# Patient Record
Sex: Female | Born: 1950 | Race: White | Hispanic: No | Marital: Married | Smoking: Never smoker
Health system: Southern US, Community
[De-identification: ages and names within clinical notes are randomized; demographics above are authoritative.]

## PROBLEM LIST (undated history)

## (undated) DIAGNOSIS — Z460 Encounter for fitting and adjustment of spectacles and contact lenses: Secondary | ICD-10-CM

## (undated) DIAGNOSIS — E7800 Pure hypercholesterolemia, unspecified: Secondary | ICD-10-CM

## (undated) DIAGNOSIS — N6019 Diffuse cystic mastopathy of unspecified breast: Secondary | ICD-10-CM

## (undated) DIAGNOSIS — O039 Complete or unspecified spontaneous abortion without complication: Secondary | ICD-10-CM

## (undated) DIAGNOSIS — C801 Malignant (primary) neoplasm, unspecified: Secondary | ICD-10-CM

## (undated) DIAGNOSIS — K219 Gastro-esophageal reflux disease without esophagitis: Secondary | ICD-10-CM

## (undated) HISTORY — DX: Diffuse cystic mastopathy of unspecified breast: N60.19

## (undated) HISTORY — PX: STOMACH SURGERY: SHX791

## (undated) HISTORY — DX: Pure hypercholesterolemia, unspecified: E78.00

## (undated) HISTORY — PX: TUBAL LIGATION: SHX77

## (undated) HISTORY — DX: Complete or unspecified spontaneous abortion without complication: O03.9

## (undated) HISTORY — PX: DILATION AND CURETTAGE OF UTERUS: SHX78

## (undated) HISTORY — DX: Gastro-esophageal reflux disease without esophagitis: K21.9

## (undated) HISTORY — PX: BREAST CYST ASPIRATION: SHX578

## (undated) HISTORY — PX: FRACTURE SURGERY: SHX138

## (undated) HISTORY — PX: CHOLECYSTECTOMY: SHX55

## (undated) HISTORY — PX: LIVER SURGERY: SHX698

---

## 1993-08-21 HISTORY — PX: INCISION AND DRAINAGE BREAST ABSCESS: SUR672

## 2004-10-05 ENCOUNTER — Ambulatory Visit: Payer: Self-pay | Admitting: Internal Medicine

## 2005-12-06 ENCOUNTER — Ambulatory Visit: Payer: Self-pay | Admitting: Internal Medicine

## 2007-02-19 ENCOUNTER — Ambulatory Visit: Payer: Self-pay | Admitting: Internal Medicine

## 2007-10-30 ENCOUNTER — Ambulatory Visit: Payer: Self-pay

## 2008-03-09 ENCOUNTER — Ambulatory Visit: Payer: Self-pay

## 2010-03-16 ENCOUNTER — Ambulatory Visit: Payer: Self-pay

## 2010-08-09 ENCOUNTER — Ambulatory Visit: Payer: Self-pay | Admitting: Gastroenterology

## 2011-03-20 ENCOUNTER — Ambulatory Visit: Payer: Self-pay | Admitting: Internal Medicine

## 2012-07-09 ENCOUNTER — Ambulatory Visit: Payer: Self-pay | Admitting: Internal Medicine

## 2012-08-20 ENCOUNTER — Encounter: Payer: Self-pay | Admitting: Internal Medicine

## 2012-08-20 ENCOUNTER — Ambulatory Visit (INDEPENDENT_AMBULATORY_CARE_PROVIDER_SITE_OTHER): Payer: Self-pay | Admitting: Internal Medicine

## 2012-08-20 ENCOUNTER — Other Ambulatory Visit (HOSPITAL_COMMUNITY)
Admission: RE | Admit: 2012-08-20 | Discharge: 2012-08-20 | Disposition: A | Discharge status: Home / Self Care | Payer: Self-pay | Source: Ambulatory Visit | Attending: Internal Medicine | Admitting: Internal Medicine

## 2012-08-20 VITALS — BP 118/80 | HR 84 | Temp 97.9°F | Ht 66.5 in | Wt 200.0 lb

## 2012-08-20 DIAGNOSIS — K625 Hemorrhage of anus and rectum: Secondary | ICD-10-CM

## 2012-08-20 DIAGNOSIS — R5383 Other fatigue: Secondary | ICD-10-CM

## 2012-08-20 DIAGNOSIS — Z139 Encounter for screening, unspecified: Secondary | ICD-10-CM

## 2012-08-20 DIAGNOSIS — Z1151 Encounter for screening for human papillomavirus (HPV): Secondary | ICD-10-CM | POA: Insufficient documentation

## 2012-08-20 DIAGNOSIS — K219 Gastro-esophageal reflux disease without esophagitis: Secondary | ICD-10-CM | POA: Insufficient documentation

## 2012-08-20 DIAGNOSIS — Z01419 Encounter for gynecological examination (general) (routine) without abnormal findings: Secondary | ICD-10-CM | POA: Insufficient documentation

## 2012-08-20 DIAGNOSIS — R5381 Other malaise: Secondary | ICD-10-CM

## 2012-08-20 DIAGNOSIS — E7800 Pure hypercholesterolemia, unspecified: Secondary | ICD-10-CM

## 2012-08-20 LAB — CBC WITH DIFFERENTIAL/PLATELET
Basophils Absolute: 0 10*3/uL (ref 0.0–0.1)
HCT: 41.8 % (ref 36.0–46.0)
Lymphs Abs: 2.3 10*3/uL (ref 0.7–4.0)
Monocytes Relative: 10.3 % (ref 3.0–12.0)
Neutrophils Relative %: 48.6 % (ref 43.0–77.0)
Platelets: 265 10*3/uL (ref 150.0–400.0)
RDW: 12.9 % (ref 11.5–14.6)

## 2012-08-20 LAB — COMPREHENSIVE METABOLIC PANEL
ALT: 20 U/L (ref 0–35)
Alkaline Phosphatase: 52 U/L (ref 39–117)
CO2: 28 mEq/L (ref 19–32)
GFR: 101.15 mL/min (ref >60.00)
Sodium: 140 mEq/L (ref 135–145)
Total Bilirubin: 0.7 mg/dL (ref 0.3–1.2)
Total Protein: 6.9 g/dL (ref 6.0–8.3)

## 2012-08-20 LAB — LIPID PANEL
HDL: 62.6 mg/dL (ref >39.00)
VLDL: 27.4 mg/dL (ref 0.0–40.0)

## 2012-08-20 LAB — LDL CHOLESTEROL, DIRECT: Direct LDL: 171.5 mg/dL

## 2012-08-21 ENCOUNTER — Encounter: Payer: Self-pay | Admitting: Internal Medicine

## 2012-08-21 NOTE — Assessment & Plan Note (Signed)
Low cholesterol diet and exercise.  Check lipid profile.   

## 2012-08-21 NOTE — Assessment & Plan Note (Signed)
Persistent symptoms.  Take prilosec regularly.  Heme positive on exam.  Refer to GI for evaluation.  Check cbc.

## 2012-08-21 NOTE — Progress Notes (Signed)
  Subjective:    Patient ID: Dawn Swanson, female    DOB: July 11, 1951, 62 y.o.   MRN: [id number]  HPI 62 year old female with past history of GERD, fibrocystic breast disease and hypercholesterolemia who comes in today to follow up on these issues as well as for a complete physical exam.  She still has problems with increased acid reflux.  She does not take the omeprazole regularly.  Certain foods do aggravate.  No abdominal pain or cramping.  No bowel change.  No blood in her stool.  No cardiac symptoms with increased activity or exertion.  Increased stress with family situation.  Feels she is handling things relatively well.    Past Medical History  Diagnosis Date  . GERD (gastroesophageal reflux disease)   . Hypercholesterolemia   . Miscarriage   . Fibrocystic breast disease     Current Outpatient Prescriptions on File Prior to Visit  Medication Sig Dispense Refill  . omeprazole (PRILOSEC) 20 MG capsule Take 20 mg by mouth daily.      . simvastatin (ZOCOR) 10 MG tablet Take 10 mg by mouth at bedtime.        Review of Systems Patient denies any headache, lightheadedness or dizziness.  No sinus or allergy symptoms. No chest pain, tightness or palpitations.  No increased shortness of breath, cough or congestion.  No nausea or vomiting.  Acid reflux symptoms as outlined.  No abdominal pain or cramping.  No bowel change, such as diarrhea, constipation, BRBPR or melana.  No urine change.  No vaginal problems.  Feels she is handling stress relatively well.      Objective:   Physical Exam Filed Vitals:   08/20/12 1036  BP: 118/80  Pulse: 84  Temp: 97.9 F (30.52 C)   62 year old female in no acute distress.   HEENT:  Nares- clear.  Oropharynx - without lesions. NECK:  Supple.  Nontender.  No audible bruit.  HEART:  Appears to be regular. LUNGS:  No crackles or wheezing audible.  Respirations even and unlabored.  RADIAL PULSE:  Equal bilaterally.    BREASTS:  No nipple discharge or  nipple retraction present.  Could not appreciate any distinct nodules or axillary adenopathy.  ABDOMEN:  Soft, nontender.  Bowel sounds present and normal.  No audible abdominal bruit.  GU:  Normal external genitalia.  Vaginal vault without lesions.  Cervix identified.  Pap performed. Could not appreciate any adnexal masses or tenderness.   RECTAL:  Heme positive.   EXTREMITIES:  No increased edema present.  DP pulses palpable and equal bilaterally.           Assessment & Plan:  GI.  Heme positive on exam.  Check cbc.  Persistent reflux.  Take prilosec regularly.  Colonoscopy 08/09/10 with diverticulosis.  Recommended follow up colonoscopy five years.  Has had some problems with hemorrhoids.  Will treat with annusol HC suppositories.  IFOB after completion.  Refer to GI for evaluation.    CARDIOVASCULAR.  Asymptomatic.  FATIGUE.  Did report some fatigue.  Check cbc, met c and tsh.    INCREASED PSYCHOSOCIAL STRESSORS.  Feels she is handling things relatively well.  Follow.   HEALTH MAINTENANCE.  Physical today.  Pap today.  Schedule mammogram.  GI referral as outlined.  Has wanted to hold on bone density.

## 2012-08-27 ENCOUNTER — Encounter: Payer: Self-pay | Admitting: *Deleted

## 2012-09-02 ENCOUNTER — Other Ambulatory Visit (INDEPENDENT_AMBULATORY_CARE_PROVIDER_SITE_OTHER): Payer: Self-pay | Admitting: *Deleted

## 2012-09-02 ENCOUNTER — Other Ambulatory Visit: Payer: Self-pay

## 2012-09-02 DIAGNOSIS — Z139 Encounter for screening, unspecified: Secondary | ICD-10-CM

## 2012-10-05 ENCOUNTER — Other Ambulatory Visit: Payer: Self-pay

## 2012-10-18 ENCOUNTER — Encounter: Payer: Self-pay | Admitting: Internal Medicine

## 2012-10-18 ENCOUNTER — Ambulatory Visit (INDEPENDENT_AMBULATORY_CARE_PROVIDER_SITE_OTHER): Payer: Self-pay | Admitting: Internal Medicine

## 2012-10-18 VITALS — BP 120/80 | HR 102 | Temp 98.4°F | Ht 66.5 in | Wt 185.5 lb

## 2012-10-18 DIAGNOSIS — E7800 Pure hypercholesterolemia, unspecified: Secondary | ICD-10-CM

## 2012-10-18 DIAGNOSIS — K219 Gastro-esophageal reflux disease without esophagitis: Secondary | ICD-10-CM

## 2012-10-18 MED ORDER — FLUTICASONE PROPIONATE 50 MCG/ACT NA SUSP: 2.0000 | Freq: Every day | NASAL | Status: DC

## 2012-10-18 NOTE — Patient Instructions (Signed)
Saline nasal spray -  Flush nose two to three times per day.  Flonase nasal spray - two sprays each nostril one time per day - in the evening.  Mucinex DM - take one in the evening.  Robitussin DM in the evening.  I will give you an antibiotic to have if needed.

## 2012-10-20 ENCOUNTER — Encounter: Payer: Self-pay | Admitting: Internal Medicine

## 2012-10-20 NOTE — Progress Notes (Signed)
Subjective:    Patient ID: Dawn Swanson, female    DOB: 03-03-1951, 62 y.o.   MRN: [id number]  HPI 62 year old female with past history of GERD, fibrocystic breast disease and hypercholesterolemia who comes in today for a scheduled follow up.   She was having  problems with increased acid reflux. See last note for details.  Now taking omeprazole regularly.  Acid reflux improved/resolved.  No abdominal pain or cramping.  No bowel change.  No blood in her stool.  Used the suppositories and turned in her stool test.  Negative for blood.  She cancelled her GI appt and declines to reschedule.  States she does not feel she needs to go.   No cardiac symptoms with increased activity or exertion.  Increased stress with family situation.  Feels she is handling things relatively well.    Past Medical History  Diagnosis Date  . GERD (gastroesophageal reflux disease)   . Hypercholesterolemia   . Miscarriage   . Fibrocystic breast disease     Current Outpatient Prescriptions on File Prior to Visit  Medication Sig Dispense Refill  . omeprazole (PRILOSEC) 20 MG capsule Take 20 mg by mouth daily.      . simvastatin (ZOCOR) 10 MG tablet Take 10 mg by mouth at bedtime.       No current facility-administered medications on file prior to visit.    Review of Systems Patient denies any headache, lightheadedness or dizziness.  She does report that her granddaughter was sick - 2/11.  Since approximately 10/06/12, she has had increased sinus pressure.  Yellow/green mucus production.  Increased cough.  No chest congestion or sore throat.  Intermittent subjective fever.  Has taken some mucinex.  No nausea or vomiting.  Acid reflux symptoms controlled now.   No abdominal pain or cramping.  No bowel change, such as diarrhea, constipation, BRBPR or melana.  No urine change.  No vaginal problems.  Feels she is handling stress relatively well.      Objective:   Physical Exam  Filed Vitals:   10/18/12 1055  BP: 120/80   Pulse: 102  Temp: 98.4 F (47.35 C)   62 year old female in no acute distress.   HEENT:  Nares- clear except slightly erythematous turbinates.   Oropharynx - without lesions.  Minimal tenderness to palpation over the sinuses.  TMs visualized without erythema.  NECK:  Supple.  Nontender.  No audible bruit.  HEART:  Appears to be regular. LUNGS:  No crackles or wheezing audible.  Respirations even and unlabored.  RADIAL PULSE:  Equal bilaterally.   ABDOMEN:  Soft, nontender.  Bowel sounds present and normal.  No audible abdominal bruit.    EXTREMITIES:  No increased edema present.  DP pulses palpable and equal bilaterally.           Assessment & Plan:  GI.  Heme positive on exam last visit.  Also had persistent reflux.  Was instructed to take prilosec regularly.  Colonoscopy 08/09/10 with diverticulosis.  Recommended follow up colonoscopy five years.  Has had some problems with hemorrhoids.  Was treated with annusol HC suppositories.  IFOB negative.  Was referred to GI for evaluation.  She cancelled.  Acid better.  Declines referral.     CARDIOVASCULAR.  Asymptomatic.   INCREASED PSYCHOSOCIAL STRESSORS.  Feels she is handling things relatively well.  Follow.   HEALTH MAINTENANCE.  Physical last visit.  Pap negative.   She wants to get her mammo through the [facility].  Needs to schedule mammogram.  GI - as outlined.  Has wanted to hold on bone density.

## 2012-10-20 NOTE — Assessment & Plan Note (Signed)
Taking omeprazole regularly now.  No symptoms.  Follow.  Declines GI referral.

## 2012-10-20 NOTE — Assessment & Plan Note (Addendum)
Low cholesterol diet and exercise.  Has adjusted her diet and lost weight since her last visit.  Cholesterol just checked 09/25/12 revealed total cholesterol 239, triglycerides 137, HDL 63 and LDL 171.  Follow.  Declines medication.

## 2013-01-19 ENCOUNTER — Emergency Department: Payer: Self-pay | Admitting: Emergency Medicine

## 2013-01-21 ENCOUNTER — Other Ambulatory Visit: Payer: Self-pay | Admitting: Orthopedic Surgery

## 2013-01-21 ENCOUNTER — Encounter (HOSPITAL_BASED_OUTPATIENT_CLINIC_OR_DEPARTMENT_OTHER): Payer: Self-pay | Admitting: *Deleted

## 2013-01-21 NOTE — Progress Notes (Signed)
Pt came from [facility]-no heart or resp problems-uses mostly herbal or essential oils for pain-last labs 12/13-no ekg or cxr many years

## 2013-01-22 ENCOUNTER — Encounter (HOSPITAL_BASED_OUTPATIENT_CLINIC_OR_DEPARTMENT_OTHER)
Admission: RE | Disposition: A | Discharge status: Home / Self Care | Payer: Self-pay | Source: Ambulatory Visit | Attending: Orthopedic Surgery

## 2013-01-22 ENCOUNTER — Ambulatory Visit (HOSPITAL_BASED_OUTPATIENT_CLINIC_OR_DEPARTMENT_OTHER)
Admission: RE | Admit: 2013-01-22 | Discharge: 2013-01-22 | Disposition: A | Discharge status: Home / Self Care | Payer: Self-pay | Source: Ambulatory Visit | Attending: Orthopedic Surgery | Admitting: Orthopedic Surgery

## 2013-01-22 ENCOUNTER — Encounter (HOSPITAL_BASED_OUTPATIENT_CLINIC_OR_DEPARTMENT_OTHER): Payer: Self-pay | Admitting: *Deleted

## 2013-01-22 ENCOUNTER — Ambulatory Visit (HOSPITAL_BASED_OUTPATIENT_CLINIC_OR_DEPARTMENT_OTHER): Payer: Self-pay | Admitting: Certified Registered Nurse Anesthetist

## 2013-01-22 ENCOUNTER — Encounter (HOSPITAL_BASED_OUTPATIENT_CLINIC_OR_DEPARTMENT_OTHER): Payer: Self-pay | Admitting: Certified Registered Nurse Anesthetist

## 2013-01-22 DIAGNOSIS — W19XXXA Unspecified fall, initial encounter: Secondary | ICD-10-CM | POA: Insufficient documentation

## 2013-01-22 DIAGNOSIS — S52022A Displaced fracture of olecranon process without intraarticular extension of left ulna, initial encounter for closed fracture: Secondary | ICD-10-CM

## 2013-01-22 DIAGNOSIS — S52023A Displaced fracture of olecranon process without intraarticular extension of unspecified ulna, initial encounter for closed fracture: Secondary | ICD-10-CM | POA: Insufficient documentation

## 2013-01-22 HISTORY — PX: ORIF ELBOW FRACTURE: SHX5031

## 2013-01-22 HISTORY — DX: Encounter for fitting and adjustment of spectacles and contact lenses: Z46.0

## 2013-01-22 SURGERY — OPEN REDUCTION INTERNAL FIXATION (ORIF) ELBOW/OLECRANON FRACTURE
Anesthesia: Regional | Site: Elbow | Laterality: Left | Wound class: Clean

## 2013-01-22 MED ORDER — OXYCODONE-ACETAMINOPHEN 5-325 MG PO TABS: 1.0000 | ORAL_TABLET | ORAL | Status: DC | PRN

## 2013-01-22 MED ORDER — LACTATED RINGERS IV SOLN
INTRAVENOUS | Status: DC
  Administered 2013-01-22 (×2): via INTRAVENOUS

## 2013-01-22 MED ORDER — BUPIVACAINE-EPINEPHRINE PF 0.5-1:200000 % IJ SOLN
INTRAMUSCULAR | Status: DC | PRN
  Administered 2013-01-22: 30 mL

## 2013-01-22 MED ORDER — VANCOMYCIN HCL IN DEXTROSE 1-5 GM/200ML-% IV SOLN
1000.0000 mg | INTRAVENOUS | Status: AC
  Administered 2013-01-22: 1000 mg via INTRAVENOUS

## 2013-01-22 MED ORDER — DEXAMETHASONE SODIUM PHOSPHATE 10 MG/ML IJ SOLN
INTRAMUSCULAR | Status: DC | PRN
  Administered 2013-01-22: 10 mg via INTRAVENOUS

## 2013-01-22 MED ORDER — MIDAZOLAM HCL 5 MG/5ML IJ SOLN
INTRAMUSCULAR | Status: DC | PRN
  Administered 2013-01-22: 1 mg via INTRAVENOUS

## 2013-01-22 MED ORDER — FENTANYL CITRATE 0.05 MG/ML IJ SOLN
50.0000 ug | INTRAMUSCULAR | Status: DC | PRN
  Administered 2013-01-22: 50 ug via INTRAVENOUS

## 2013-01-22 MED ORDER — ONDANSETRON HCL 4 MG/2ML IJ SOLN
INTRAMUSCULAR | Status: DC | PRN
  Administered 2013-01-22: 4 mg via INTRAVENOUS

## 2013-01-22 MED ORDER — PROPOFOL 10 MG/ML IV BOLUS
INTRAVENOUS | Status: DC | PRN
  Administered 2013-01-22: 200 mg via INTRAVENOUS

## 2013-01-22 MED ORDER — BUPIVACAINE HCL (PF) 0.5 % IJ SOLN
INTRAMUSCULAR | Status: DC | PRN
  Administered 2013-01-22: 10 mL

## 2013-01-22 MED ORDER — MIDAZOLAM HCL 2 MG/2ML IJ SOLN
1.0000 mg | INTRAMUSCULAR | Status: DC | PRN
  Administered 2013-01-22: 1 mg via INTRAVENOUS

## 2013-01-22 MED ORDER — OXYCODONE HCL 5 MG PO TABS: 5.0000 mg | ORAL_TABLET | Freq: Once | ORAL | Status: DC | PRN

## 2013-01-22 MED ORDER — HYDROMORPHONE HCL PF 1 MG/ML IJ SOLN: 0.2500 mg | INTRAMUSCULAR | Status: DC | PRN

## 2013-01-22 MED ORDER — OXYCODONE HCL 5 MG/5ML PO SOLN: 5.0000 mg | Freq: Once | ORAL | Status: DC | PRN

## 2013-01-22 MED ORDER — CHLORHEXIDINE GLUCONATE 4 % EX LIQD: 60.0000 mL | Freq: Once | CUTANEOUS | Status: DC

## 2013-01-22 MED ORDER — LIDOCAINE HCL (CARDIAC) 20 MG/ML IV SOLN
INTRAVENOUS | Status: DC | PRN
  Administered 2013-01-22: 30 mg via INTRAVENOUS

## 2013-01-22 MED ORDER — 0.9 % SODIUM CHLORIDE (POUR BTL) OPTIME
TOPICAL | Status: DC | PRN
  Administered 2013-01-22: 1000 mL

## 2013-01-22 SURGICAL SUPPLY — 86 items
APL SKNCLS STERI-STRIP NONHPOA (GAUZE/BANDAGES/DRESSINGS)
BAG DECANTER FOR FLEXI CONT (MISCELLANEOUS) IMPLANT
BANDAGE ELASTIC 3 VELCRO ST LF (GAUZE/BANDAGES/DRESSINGS) ×2 IMPLANT
BANDAGE ELASTIC 4 VELCRO ST LF (GAUZE/BANDAGES/DRESSINGS) ×4 IMPLANT
BANDAGE GAUZE ELAST BULKY 4 IN (GAUZE/BANDAGES/DRESSINGS) ×2 IMPLANT
BENZOIN TINCTURE PRP APPL 2/3 (GAUZE/BANDAGES/DRESSINGS) IMPLANT
BIT DRILL 2 FAST STEP (BIT) ×2 IMPLANT
BIT DRILL 2.5X2.75 QC CALB (BIT) ×2 IMPLANT
BLADE MINI RND TIP GREEN BEAV (BLADE) IMPLANT
BLADE SURG 15 STRL LF DISP TIS (BLADE) ×1 IMPLANT
BLADE SURG 15 STRL SS (BLADE) ×2
BNDG CMPR 9X4 STRL LF SNTH (GAUZE/BANDAGES/DRESSINGS) ×1
BNDG CMPR MD 5X2 ELC HKLP STRL (GAUZE/BANDAGES/DRESSINGS)
BNDG ELASTIC 2 VLCR STRL LF (GAUZE/BANDAGES/DRESSINGS) IMPLANT
BNDG ESMARK 4X9 LF (GAUZE/BANDAGES/DRESSINGS) ×2 IMPLANT
CANISTER SUCTION 1200CC (MISCELLANEOUS) ×2 IMPLANT
CLOTH BEACON ORANGE TIMEOUT ST (SAFETY) ×2 IMPLANT
CORDS BIPOLAR (ELECTRODE) ×2 IMPLANT
COVER TABLE BACK 60X90 (DRAPES) ×2 IMPLANT
CUFF TOURNIQUET SINGLE 18IN (TOURNIQUET CUFF) ×2 IMPLANT
DECANTER SPIKE VIAL GLASS SM (MISCELLANEOUS) IMPLANT
DRAPE EXTREMITY T 121X128X90 (DRAPE) ×2 IMPLANT
DRAPE OEC MINIVIEW 54X84 (DRAPES) ×2 IMPLANT
DRAPE SURG 17X23 STRL (DRAPES) ×2 IMPLANT
DURAPREP 26ML APPLICATOR (WOUND CARE) ×2 IMPLANT
ELECT REM PT RETURN 9FT ADLT (ELECTROSURGICAL)
ELECTRODE REM PT RTRN 9FT ADLT (ELECTROSURGICAL) IMPLANT
GAUZE SPONGE 4X4 16PLY XRAY LF (GAUZE/BANDAGES/DRESSINGS) IMPLANT
GAUZE XEROFORM 1X8 LF (GAUZE/BANDAGES/DRESSINGS) IMPLANT
GLOVE BIO SURGEON STRL SZ 6.5 (GLOVE) ×2 IMPLANT
GLOVE BIO SURGEON STRL SZ8 (GLOVE) ×2 IMPLANT
GLOVE BIOGEL PI IND STRL 7.0 (GLOVE) ×1 IMPLANT
GLOVE BIOGEL PI IND STRL 8.5 (GLOVE) ×1 IMPLANT
GLOVE BIOGEL PI INDICATOR 7.0 (GLOVE) ×1
GLOVE BIOGEL PI INDICATOR 8.5 (GLOVE) ×1
GLOVE EXAM NITRILE LRG STRL (GLOVE) ×2 IMPLANT
GLOVE SURG ORTHO 8.0 STRL STRW (GLOVE) ×2 IMPLANT
GOWN PREVENTION PLUS XLARGE (GOWN DISPOSABLE) ×2 IMPLANT
GOWN PREVENTION PLUS XXLARGE (GOWN DISPOSABLE) ×2 IMPLANT
GOWN STRL REIN 2XL XLG LVL4 (GOWN DISPOSABLE) ×2 IMPLANT
LOOP VESSEL MAXI BLUE (MISCELLANEOUS) IMPLANT
NEEDLE HYPO 25X1 1.5 SAFETY (NEEDLE) IMPLANT
NS IRRIG 1000ML POUR BTL (IV SOLUTION) ×2 IMPLANT
PACK BASIN DAY SURGERY FS (CUSTOM PROCEDURE TRAY) ×2 IMPLANT
PAD CAST 3X4 CTTN HI CHSV (CAST SUPPLIES) ×1 IMPLANT
PAD CAST 4YDX4 CTTN HI CHSV (CAST SUPPLIES) ×2 IMPLANT
PADDING CAST ABS 4INX4YD NS (CAST SUPPLIES) ×1
PADDING CAST ABS COTTON 4X4 ST (CAST SUPPLIES) ×1 IMPLANT
PADDING CAST COTTON 3X4 STRL (CAST SUPPLIES) ×2
PADDING CAST COTTON 4X4 STRL (CAST SUPPLIES) ×4
PADDING UNDERCAST 2  STERILE (CAST SUPPLIES) IMPLANT
PENCIL BUTTON HOLSTER BLD 10FT (ELECTRODE) IMPLANT
PLATE OLECRANON LRG (Plate) ×2 IMPLANT
SCREW CORT T15 24X3.5XST LCK (Screw) ×1 IMPLANT
SCREW CORTICAL 3.5X24MM (Screw) ×2 IMPLANT
SCREW CORTICAL LOW PROF 3.5X20 (Screw) ×2 IMPLANT
SCREW LOCK CORT STAR 3.5X36 (Screw) ×2 IMPLANT
SCREW LOW PROFILE 18MMX3.5MM (Screw) ×2 IMPLANT
SCREW LOW PROFILE 22MMX3.5MM (Screw) ×4 IMPLANT
SCREW NON LOCKING LP 3.5 16MM (Screw) ×6 IMPLANT
SCREW PEG 2.5X16 NONLOCK (Screw) ×2 IMPLANT
SCREW PEG 2.5X22 NONLOCK (Screw) ×2 IMPLANT
SHEET MEDIUM DRAPE 40X70 STRL (DRAPES) ×4 IMPLANT
SLING ARM FOAM STRAP XLG (SOFTGOODS) ×2 IMPLANT
SPLINT FAST PLASTER 5X30 (CAST SUPPLIES) ×10
SPLINT PLASTER CAST FAST 5X30 (CAST SUPPLIES) ×10 IMPLANT
SPLINT PLASTER CAST XFAST 4X15 (CAST SUPPLIES) IMPLANT
SPLINT PLASTER XTRA FAST SET 4 (CAST SUPPLIES)
SPONGE GAUZE 4X4 12PLY (GAUZE/BANDAGES/DRESSINGS) ×2 IMPLANT
STOCKINETTE 4X48 STRL (DRAPES) ×2 IMPLANT
STRIP CLOSURE SKIN 1/2X4 (GAUZE/BANDAGES/DRESSINGS) IMPLANT
SUCTION FRAZIER TIP 10 FR DISP (SUCTIONS) ×2 IMPLANT
SUT ETHILON 4 0 PS 2 18 (SUTURE) IMPLANT
SUT MERSILENE 4 0 P 3 (SUTURE) IMPLANT
SUT PROLENE 3 0 PS 2 (SUTURE) IMPLANT
SUT SILK 2 0 FS (SUTURE) IMPLANT
SUT VIC AB 2-0 SH 27 (SUTURE) ×4
SUT VIC AB 2-0 SH 27XBRD (SUTURE) ×2 IMPLANT
SUT VIC AB 3-0 FS2 27 (SUTURE) ×2 IMPLANT
SUT VICRYL RAPIDE 4/0 PS 2 (SUTURE) ×6 IMPLANT
SYR BULB 3OZ (MISCELLANEOUS) ×2 IMPLANT
SYRINGE 10CC LL (SYRINGE) IMPLANT
TOWEL OR 17X24 6PK STRL BLUE (TOWEL DISPOSABLE) ×4 IMPLANT
TUBE CONNECTING 20X1/4 (TUBING) ×2 IMPLANT
UNDERPAD 30X30 INCONTINENT (UNDERPADS AND DIAPERS) ×2 IMPLANT
WASHER 3.5MM (Orthopedic Implant) ×4 IMPLANT

## 2013-01-22 NOTE — H&P (Signed)
Dawn Swanson is an 62 y.o. female.   Chief Complaint: left elbow pain HPI: as above s/p fall onto left elbow with displaced olecranon fracture  Past Medical History  Diagnosis Date  . GERD (gastroesophageal reflux disease)   . Hypercholesterolemia   . Miscarriage   . Fibrocystic breast disease   . Contact lens/glasses fitting     wears contacts or glasses    Past Surgical History  Procedure Laterality Date  . Dilation and curettage of uterus    . Incision and drainage breast abscess  1995  . Tubal ligation    . Breast cyst aspiration      Family History  Problem Relation Age of Onset  . Stomach cancer Father   . Kidney failure Mother   . Hodgkin's lymphoma Sister     died age 41  . Glaucoma Brother   . Breast cancer Neg Hx   . Colon cancer Neg Hx    Social History:  reports that she has never smoked. She has never used smokeless tobacco. She reports that she does not drink alcohol or use illicit drugs.  Allergies:  Allergies  Allergen Reactions  . Penicillins Swelling    Medications Prior to Admission  Medication Sig Dispense Refill  . omeprazole (PRILOSEC) 20 MG capsule Take 20 mg by mouth daily.      Marland Kitchen oxyCODONE-acetaminophen (PERCOCET) 10-325 MG per tablet Take 1 tablet by mouth every 4 (four) hours as needed for pain.      . fluticasone (FLONASE) 50 MCG/ACT nasal spray Place 2 sprays into the nose daily.  16 g  1    No results found for this or any previous visit (from the past 48 hour(s)). No results found.  Review of Systems  All other systems reviewed and are negative.    Blood pressure 124/52, pulse 64, temperature 97.8 F (36.6 C), temperature source Oral, resp. rate 16, height 5\' 7"  (1.702 m), weight 85.276 kg (188 lb), last menstrual period 08/21/1999, SpO2 100.00%. Physical Exam  Constitutional: She is oriented to person, place, and time. She appears well-developed and well-nourished.  HENT:  Head: Normocephalic and atraumatic.   Cardiovascular: Normal rate.   Respiratory: Effort normal.  Musculoskeletal:       Left elbow: She exhibits swelling and deformity. Tenderness found. Olecranon process tenderness noted.  Neurological: She is alert and oriented to person, place, and time.  Skin: Skin is warm.  Psychiatric: She has a normal mood and affect. Her behavior is normal. Judgment and thought content normal.     Assessment/Plan As above  Plan ORIF  Syon Tews A 01/22/2013, 12:02 PM

## 2013-01-22 NOTE — Op Note (Signed)
See note [id number]

## 2013-01-22 NOTE — Anesthesia Preprocedure Evaluation (Signed)
Anesthesia Evaluation  Patient identified by MRN, date of birth, ID band Patient awake    Reviewed: Allergy & Precautions, H&P , NPO status , Patient's Chart, lab work & pertinent test results  Airway Mallampati: II TM Distance: >3 FB Neck ROM: Full    Dental no notable dental hx. (+) Chipped and Dental Advisory Given   Pulmonary neg pulmonary ROS,  breath sounds clear to auscultation  Pulmonary exam normal       Cardiovascular negative cardio ROS  Rhythm:Regular Rate:Normal     Neuro/Psych negative neurological ROS  negative psych ROS   GI/Hepatic Neg liver ROS, GERD-  Medicated and Controlled,  Endo/Other  negative endocrine ROS  Renal/GU negative Renal ROS  negative genitourinary   Musculoskeletal   Abdominal   Peds  Hematology negative hematology ROS (+)   Anesthesia Other Findings   Reproductive/Obstetrics negative OB ROS                           Anesthesia Physical Anesthesia Plan  ASA: II  Anesthesia Plan: General and Regional   Post-op Pain Management:    Induction: Intravenous  Airway Management Planned: LMA  Additional Equipment:   Intra-op Plan:   Post-operative Plan: Extubation in OR  Informed Consent: I have reviewed the patients History and Physical, chart, labs and discussed the procedure including the risks, benefits and alternatives for the proposed anesthesia with the patient or authorized representative who has indicated his/her understanding and acceptance.   Dental advisory given  Plan Discussed with: CRNA  Anesthesia Plan Comments:         Anesthesia Quick Evaluation

## 2013-01-22 NOTE — Anesthesia Procedure Notes (Signed)
Anesthesia Regional Block:  Supraclavicular block  Pre-Anesthetic Checklist: ,, timeout performed, Correct Patient, Correct Site, Correct Laterality, Correct Procedure, Correct Position, site marked, Risks and benefits discussed, pre-op evaluation, post-op pain management  Laterality: Left  Prep: Maximum Sterile Barrier Precautions used and chloraprep       Needles:  Injection technique: Single-shot  Needle Type: Echogenic Stimulator Needle     Needle Length: 5cm 5 cm Needle Gauge: 22 and 22 G    Additional Needles:  Procedures: ultrasound guided (picture in chart) Supraclavicular block Narrative:  Start time: 01/22/2013 11:01 AM End time: 01/22/2013 11:21 AM Injection made incrementally with aspirations every 5 mL. Anesthesiologist: Nyana Haren,MD  Additional Notes: 2% Lidocaine skin wheel. Intercostobrachial block with 5cc of 0.5% Bupivicaine plain.  Supraclavicular block

## 2013-01-22 NOTE — Transfer of Care (Signed)
Immediate Anesthesia Transfer of Care Note  Patient: Dawn Swanson  Procedure(s) Performed: Procedure(s): OPEN REDUCTION INTERNAL FIXATION (ORIF) ELBOW/OLECRANON FRACTURE (Left)  Patient Location: PACU  Anesthesia Type:GA combined with regional for post-op pain  Level of Consciousness: awake and patient cooperative  Airway & Oxygen Therapy: Patient Spontanous Breathing and Patient connected to face mask oxygen  Post-op Assessment: Report given to PACU RN and Post -op Vital signs reviewed and stable  Post vital signs: Reviewed and stable  Complications: No apparent anesthesia complications

## 2013-01-22 NOTE — Progress Notes (Signed)
Assisted Dr. Fitzgerald with left, ultrasound guided, supraclavicular block. Side rails up, monitors on throughout procedure. See vital signs in flow sheet. Tolerated Procedure well. 

## 2013-01-22 NOTE — Anesthesia Postprocedure Evaluation (Signed)
  Anesthesia Post-op Note  Patient: Dawn Swanson  Procedure(s) Performed: Procedure(s): OPEN REDUCTION INTERNAL FIXATION (ORIF) ELBOW/OLECRANON FRACTURE (Left)  Patient Location: PACU  Anesthesia Type:GA combined with regional for post-op pain  Level of Consciousness: awake, alert  and oriented  Airway and Oxygen Therapy: Patient Spontanous Breathing  Post-op Pain: none  Post-op Assessment: Post-op Vital signs reviewed, Patient's Cardiovascular Status Stable, Respiratory Function Stable, Patent Airway and No signs of Nausea or vomiting  Post-op Vital Signs: Reviewed and stable  Complications: No apparent anesthesia complications

## 2013-01-23 ENCOUNTER — Encounter (HOSPITAL_BASED_OUTPATIENT_CLINIC_OR_DEPARTMENT_OTHER): Payer: Self-pay | Admitting: Orthopedic Surgery

## 2013-01-23 LAB — POCT HEMOGLOBIN-HEMACUE: Hemoglobin: 14.7 g/dL (ref 12.0–15.0)

## 2013-01-23 NOTE — Op Note (Signed)
NAMEGAYE, SCORZA NO.:  [id number]  MEDICAL RECORD NO.:  [id number]  LOCATION:                                 FACILITY:  PHYSICIAN:  Artist Pais. Wilbur Oakland, M.D.DATE OF BIRTH:  [birth year]  DATE OF PROCEDURE:  01/22/2013 DATE OF DISCHARGE:  01/22/2013                              OPERATIVE REPORT   PREOPERATIVE DIAGNOSIS:  Displaced olecranon fracture, left side.  POSTOPERATIVE DIAGNOSIS:  Displaced olecranon fracture, left side.  PROCEDURE:  Open reduction and internal fixation, left olecranon fracture.  SURGEON:  Artist Pais. Mina Marble, MD  ASSISTANT:  Cindee Salt, MD  ANESTHESIA:  General.  TOURNIQUET TIME:  An hour and 3 minutes.  No complication.  No drains.  DESCRIPTION OF PROCEDURE:  The patient was taken to the operating suite. After the induction of adequate general anesthesia, left upper extremity was prepped and draped in usual sterile fashion.  The patient also had a preoperative supraclavicular block with good analgesia obtained.  When she was prepped and draped, an Esmarch was used to exsanguinate the limb.  Tourniquet was inflated to 250 mmHg.  At this point in time, a standard posterior approach to the olecranon was undertaken with the arm adducted across the chest and placed in a well-padded Mayo stand.  Skin was incised along the tip of the olecranon proximally and distally for 6- 8 cm in the direction.  Flaps were raised.  Dissection was carried down to fracture site, which easily identified.  The proximal fragment was subperiosteally dissected as well as the distal fragment.  Visualization revealed a large butterfly fragment and comminution distal to the joint. Fracture fragments were debrided of clot.  Reduction was performed with longitudinal traction and then extension of the arm using a reduction clamp.  Once this was done, 2 interfragmentary screws of 2.5 mm were placed across the butterfly fragment and next a Alps elbow  medium-sized plate was placed under direct and fluoroscopic guidance across the fracture site proximally and distally with 3 screws proximal and multiple screws distal.  Intraoperative fluoroscopy revealed adequate reduction in AP, lateral, and oblique view.  The wound was then thoroughly irrigated and closed in layers of 2-0 undyed Vicryl and 4-0 Vicryl Rapide on the skin. Xeroform, 4x4s, fluffs, and a posterior elbow splint was applied.  The patient tolerated the procedure well and went to recovery room in stable fashion.     Artist Pais Mina Marble, M.D.     MAW/MEDQ  D:  01/22/2013  T:  01/23/2013  Job:  [id number]

## 2013-02-18 ENCOUNTER — Ambulatory Visit: Payer: Self-pay | Admitting: Internal Medicine

## 2013-02-25 ENCOUNTER — Encounter: Payer: Self-pay | Admitting: Internal Medicine

## 2013-02-28 ENCOUNTER — Encounter: Payer: Self-pay | Admitting: *Deleted

## 2013-03-13 ENCOUNTER — Encounter: Payer: Self-pay | Admitting: Internal Medicine

## 2013-04-17 ENCOUNTER — Ambulatory Visit: Payer: Self-pay | Admitting: Internal Medicine

## 2013-06-26 ENCOUNTER — Other Ambulatory Visit: Payer: Self-pay

## 2013-08-25 ENCOUNTER — Encounter: Payer: Self-pay | Admitting: Internal Medicine

## 2013-08-25 ENCOUNTER — Ambulatory Visit (INDEPENDENT_AMBULATORY_CARE_PROVIDER_SITE_OTHER): Payer: Self-pay | Admitting: Internal Medicine

## 2013-08-25 ENCOUNTER — Other Ambulatory Visit (HOSPITAL_COMMUNITY)
Admission: RE | Admit: 2013-08-25 | Discharge: 2013-08-25 | Disposition: A | Discharge status: Home / Self Care | Payer: Self-pay | Source: Ambulatory Visit | Attending: Internal Medicine | Admitting: Internal Medicine

## 2013-08-25 VITALS — BP 110/80 | HR 83 | Temp 98.1°F | Ht 66.0 in | Wt 187.0 lb

## 2013-08-25 DIAGNOSIS — R5383 Other fatigue: Secondary | ICD-10-CM

## 2013-08-25 DIAGNOSIS — Z124 Encounter for screening for malignant neoplasm of cervix: Secondary | ICD-10-CM

## 2013-08-25 DIAGNOSIS — K219 Gastro-esophageal reflux disease without esophagitis: Secondary | ICD-10-CM

## 2013-08-25 DIAGNOSIS — S42309D Unspecified fracture of shaft of humerus, unspecified arm, subsequent encounter for fracture with routine healing: Secondary | ICD-10-CM

## 2013-08-25 DIAGNOSIS — E7800 Pure hypercholesterolemia, unspecified: Secondary | ICD-10-CM

## 2013-08-25 DIAGNOSIS — Z01419 Encounter for gynecological examination (general) (routine) without abnormal findings: Secondary | ICD-10-CM | POA: Insufficient documentation

## 2013-08-25 DIAGNOSIS — R112 Nausea with vomiting, unspecified: Secondary | ICD-10-CM

## 2013-08-25 DIAGNOSIS — Z1211 Encounter for screening for malignant neoplasm of colon: Secondary | ICD-10-CM

## 2013-08-25 DIAGNOSIS — R5381 Other malaise: Secondary | ICD-10-CM

## 2013-08-25 DIAGNOSIS — Z1151 Encounter for screening for human papillomavirus (HPV): Secondary | ICD-10-CM | POA: Insufficient documentation

## 2013-08-25 DIAGNOSIS — S42402D Unspecified fracture of lower end of left humerus, subsequent encounter for fracture with routine healing: Secondary | ICD-10-CM

## 2013-08-25 NOTE — Progress Notes (Signed)
Pre-visit discussion using our clinic review tool. No additional management support is needed unless otherwise documented below in the visit note.  

## 2013-08-26 ENCOUNTER — Encounter: Payer: Self-pay | Admitting: Internal Medicine

## 2013-08-26 DIAGNOSIS — S42402A Unspecified fracture of lower end of left humerus, initial encounter for closed fracture: Secondary | ICD-10-CM | POA: Insufficient documentation

## 2013-08-26 DIAGNOSIS — R112 Nausea with vomiting, unspecified: Secondary | ICD-10-CM | POA: Insufficient documentation

## 2013-08-26 NOTE — Assessment & Plan Note (Signed)
Low cholesterol diet and exercise.  Follow.  Declines medication.   

## 2013-08-26 NOTE — Assessment & Plan Note (Signed)
Off omeprazole.  Have discussed at length wit her regarding the need to take a PPI regularly to control symptoms.  She feels her symptoms have improved with decreasing sugar intake and with mineral oils.   Follow.  Declines GI referral.

## 2013-08-26 NOTE — Assessment & Plan Note (Signed)
S/p injury and left arm/elbow fracture.  S/p surgery.  Doing well.

## 2013-08-26 NOTE — Progress Notes (Signed)
Subjective:    Patient ID: Dawn Swanson, female    DOB: 21-Oct-1950, 63 y.o.   MRN: [id number]  HPI 63 year old female with past history of GERD, fibrocystic breast disease and hypercholesterolemia who comes in today to follow up on these issues as well as for a complete physical exam.  She tripped over a rug and fell and fractured her arm/elbow (01/19/13).  Is s/p surgery.  With residual fourth and fifth finger tingling.  Good rom.  Doing well.  She was having  problems with increased acid reflux. See last note for details.  Not taking omeprazole.  Has started taking essential oils.  States the acid is under reasonable control.  Still some break through.  Has intermittent nausea.  Has improved since she cut back on sugar.  No abdominal pain or cramping.  No bowel change.  No blood in her stool.  She cancelled her GI appt and declines to reschedule.  States she does not feel she needs to go.   No cardiac symptoms with increased activity or exertion.  Increased stress with family situation.  Feels she is handling things relatively well.     Past Medical History  Diagnosis Date  . GERD (gastroesophageal reflux disease)   . Hypercholesterolemia   . Miscarriage   . Fibrocystic breast disease   . Contact lens/glasses fitting     wears contacts or glasses    Review of Systems Patient denies any headache, lightheadedness or dizziness.  No increased sinus pressure or congestion. No chest pain or tightness.  No sob.   No nausea or vomiting.  Acid reflux as outlined.  Some nausea.   No abdominal pain or cramping.  No bowel change, such as diarrhea, constipation, BRBPR or melana.  No urine change.  No vaginal problems.  Feels she is handling stress relatively well.  S/p left arm/elbow fracture.  S/p surgery and doing well.  Follow.       Objective:   Physical Exam  Filed Vitals:   08/25/13 1335  BP: 110/80  Pulse: 83  Temp: 98.1 F (84.54 C)   63 year old female in no acute distress.   HEENT:   Nares- clear.  Oropharynx - without lesions. NECK:  Supple.  Nontender.  No audible bruit.  HEART:  Appears to be regular. LUNGS:  No crackles or wheezing audible.  Respirations even and unlabored.  RADIAL PULSE:  Equal bilaterally.    BREASTS:  No nipple discharge or nipple retraction present.  Could not appreciate any distinct nodules or axillary adenopathy.  ABDOMEN:  Soft, nontender.  Bowel sounds present and normal.  No audible abdominal bruit.  GU:  Normal external genitalia.  Vaginal vault without lesions.  Cervix identified.  Pap performed. Could not appreciate any adnexal masses or tenderness.   RECTAL:  Heme negative.   EXTREMITIES:  No increased edema present.  DP pulses palpable and equal bilaterally.          Assessment & Plan:  GI.  Has persistent reflux.  Was instructed to take prilosec regularly.  She has stopped.  She is taking essential oils.  Feels this is helping.  Some intermittent nausea and vomiting.  Reports better since decreasing sugar in her diet.  disucssed further w/up.  She declines.  Will monitor for other triggers.  Colonoscopy 08/09/10 with diverticulosis.  Recommended follow up colonoscopy five years..  Was referred to GI for evaluation.  She cancelled.   Declines referral back.     CARDIOVASCULAR.  Asymptomatic.   INCREASED PSYCHOSOCIAL STRESSORS.  Feels she is handling things relatively well.  Follow.   HEALTH MAINTENANCE.  Physical today.  Pelvic/pap today.   GI - as outlined.  Has wanted to hold on bone density.  Mammogram 02/18/13 - [facility].

## 2013-08-26 NOTE — Assessment & Plan Note (Signed)
Declines further w/up.  Check metabolic panel and liver panel.  Follow.

## 2013-08-27 ENCOUNTER — Encounter: Payer: Self-pay | Admitting: Internal Medicine

## 2013-10-01 ENCOUNTER — Other Ambulatory Visit (INDEPENDENT_AMBULATORY_CARE_PROVIDER_SITE_OTHER): Payer: Medicare HMO

## 2013-10-01 DIAGNOSIS — E7800 Pure hypercholesterolemia, unspecified: Secondary | ICD-10-CM

## 2013-10-01 DIAGNOSIS — R112 Nausea with vomiting, unspecified: Secondary | ICD-10-CM

## 2013-10-01 DIAGNOSIS — R5381 Other malaise: Secondary | ICD-10-CM

## 2013-10-01 DIAGNOSIS — R5383 Other fatigue: Secondary | ICD-10-CM

## 2013-10-01 LAB — HEPATIC FUNCTION PANEL
ALBUMIN: 3.3 g/dL — AB (ref 3.5–5.2)
ALT: 16 U/L (ref 0–35)
AST: 17 U/L (ref 0–37)
Alkaline Phosphatase: 48 U/L (ref 39–117)
Bilirubin, Direct: 0 mg/dL (ref 0.0–0.3)
TOTAL PROTEIN: 5.9 g/dL — AB (ref 6.0–8.3)
Total Bilirubin: 0.6 mg/dL (ref 0.3–1.2)

## 2013-10-01 LAB — LIPID PANEL
CHOLESTEROL: 243 mg/dL — AB (ref 0–200)
HDL: 58.7 mg/dL (ref >39.00)
Total CHOL/HDL Ratio: 4
Triglycerides: 193 mg/dL — ABNORMAL HIGH (ref 0.0–149.0)
VLDL: 38.6 mg/dL (ref 0.0–40.0)

## 2013-10-01 LAB — BASIC METABOLIC PANEL
BUN: 13 mg/dL (ref 6–23)
CALCIUM: 9.5 mg/dL (ref 8.4–10.5)
CHLORIDE: 105 meq/L (ref 96–112)
CO2: 27 meq/L (ref 19–32)
Creatinine, Ser: 0.6 mg/dL (ref 0.4–1.2)
GFR: 98.98 mL/min (ref >60.00)
GLUCOSE: 77 mg/dL (ref 70–99)
POTASSIUM: 4.6 meq/L (ref 3.5–5.1)
SODIUM: 140 meq/L (ref 135–145)

## 2013-10-01 LAB — CBC WITH DIFFERENTIAL/PLATELET
BASOS ABS: 0 10*3/uL (ref 0.0–0.1)
Basophils Relative: 0.6 % (ref 0.0–3.0)
EOS ABS: 0.2 10*3/uL (ref 0.0–0.7)
Eosinophils Relative: 4 % (ref 0.0–5.0)
HEMATOCRIT: 42.9 % (ref 36.0–46.0)
HEMOGLOBIN: 14.1 g/dL (ref 12.0–15.0)
LYMPHS ABS: 1.9 10*3/uL (ref 0.7–4.0)
Lymphocytes Relative: 36.5 % (ref 12.0–46.0)
MCHC: 32.7 g/dL (ref 30.0–36.0)
MCV: 89.6 fl (ref 78.0–100.0)
MONO ABS: 0.6 10*3/uL (ref 0.1–1.0)
Monocytes Relative: 11.1 % (ref 3.0–12.0)
NEUTROS ABS: 2.5 10*3/uL (ref 1.4–7.7)
Neutrophils Relative %: 47.8 % (ref 43.0–77.0)
Platelets: 292 10*3/uL (ref 150.0–400.0)
RBC: 4.79 Mil/uL (ref 3.87–5.11)
RDW: 13.6 % (ref 11.5–14.6)
WBC: 5.3 10*3/uL (ref 4.5–10.5)

## 2013-10-01 LAB — LDL CHOLESTEROL, DIRECT: Direct LDL: 164.6 mg/dL

## 2013-10-01 LAB — TSH: TSH: 4.38 u[IU]/mL (ref 0.35–5.50)

## 2013-10-07 ENCOUNTER — Encounter: Payer: Self-pay | Admitting: Internal Medicine

## 2013-11-13 ENCOUNTER — Other Ambulatory Visit (INDEPENDENT_AMBULATORY_CARE_PROVIDER_SITE_OTHER): Payer: Medicare HMO

## 2013-11-13 DIAGNOSIS — Z1211 Encounter for screening for malignant neoplasm of colon: Secondary | ICD-10-CM

## 2013-11-13 LAB — FECAL OCCULT BLOOD, IMMUNOCHEMICAL: Fecal Occult Bld: NEGATIVE

## 2013-11-14 ENCOUNTER — Encounter: Payer: Self-pay | Admitting: Internal Medicine

## 2013-11-27 ENCOUNTER — Ambulatory Visit: Payer: Self-pay | Admitting: Internal Medicine

## 2014-04-20 ENCOUNTER — Encounter: Payer: Self-pay | Admitting: Internal Medicine

## 2014-04-20 ENCOUNTER — Ambulatory Visit (INDEPENDENT_AMBULATORY_CARE_PROVIDER_SITE_OTHER): Payer: Medicare HMO | Admitting: Internal Medicine

## 2014-04-20 VITALS — BP 130/80 | HR 73 | Temp 98.4°F | Ht 66.0 in | Wt 173.2 lb

## 2014-04-20 DIAGNOSIS — R112 Nausea with vomiting, unspecified: Secondary | ICD-10-CM

## 2014-04-20 DIAGNOSIS — N814 Uterovaginal prolapse, unspecified: Secondary | ICD-10-CM

## 2014-04-20 DIAGNOSIS — R1084 Generalized abdominal pain: Secondary | ICD-10-CM

## 2014-04-20 DIAGNOSIS — K219 Gastro-esophageal reflux disease without esophagitis: Secondary | ICD-10-CM

## 2014-04-20 DIAGNOSIS — R634 Abnormal weight loss: Secondary | ICD-10-CM

## 2014-04-20 DIAGNOSIS — E7800 Pure hypercholesterolemia, unspecified: Secondary | ICD-10-CM

## 2014-04-20 NOTE — Progress Notes (Signed)
Pre visit review using our clinic review tool, if applicable. No additional management support is needed unless otherwise documented below in the visit note. 

## 2014-04-21 LAB — LIPID PANEL
CHOLESTEROL: 243 mg/dL — AB (ref 0–200)
HDL: 58.8 mg/dL (ref >39.00)
LDL CALC: 159 mg/dL — AB (ref 0–99)
NonHDL: 184.2
Total CHOL/HDL Ratio: 4
Triglycerides: 128 mg/dL (ref 0.0–149.0)
VLDL: 25.6 mg/dL (ref 0.0–40.0)

## 2014-04-21 LAB — CBC WITH DIFFERENTIAL/PLATELET
Basophils Absolute: 0 10*3/uL (ref 0.0–0.1)
Basophils Relative: 0.6 % (ref 0.0–3.0)
EOS ABS: 0.1 10*3/uL (ref 0.0–0.7)
EOS PCT: 2 % (ref 0.0–5.0)
HEMATOCRIT: 42.5 % (ref 36.0–46.0)
Hemoglobin: 14.4 g/dL (ref 12.0–15.0)
LYMPHS ABS: 2.4 10*3/uL (ref 0.7–4.0)
Lymphocytes Relative: 37.1 % (ref 12.0–46.0)
MCHC: 34 g/dL (ref 30.0–36.0)
MCV: 90.6 fl (ref 78.0–100.0)
MONO ABS: 0.5 10*3/uL (ref 0.1–1.0)
Monocytes Relative: 7.7 % (ref 3.0–12.0)
Neutro Abs: 3.4 10*3/uL (ref 1.4–7.7)
Neutrophils Relative %: 52.6 % (ref 43.0–77.0)
PLATELETS: 276 10*3/uL (ref 150.0–400.0)
RBC: 4.69 Mil/uL (ref 3.87–5.11)
RDW: 13 % (ref 11.5–15.5)
WBC: 6.5 10*3/uL (ref 4.0–10.5)

## 2014-04-21 LAB — HEPATIC FUNCTION PANEL
ALT: 16 U/L (ref 0–35)
AST: 18 U/L (ref 0–37)
Albumin: 3.3 g/dL — ABNORMAL LOW (ref 3.5–5.2)
Alkaline Phosphatase: 41 U/L (ref 39–117)
BILIRUBIN TOTAL: 0.7 mg/dL (ref 0.2–1.2)
Bilirubin, Direct: 0.1 mg/dL (ref 0.0–0.3)
Total Protein: 5.8 g/dL — ABNORMAL LOW (ref 6.0–8.3)

## 2014-04-21 LAB — BASIC METABOLIC PANEL
BUN: 9 mg/dL (ref 6–23)
CHLORIDE: 104 meq/L (ref 96–112)
CO2: 29 mEq/L (ref 19–32)
Calcium: 9.8 mg/dL (ref 8.4–10.5)
Creatinine, Ser: 0.6 mg/dL (ref 0.4–1.2)
GFR: 110.7 mL/min (ref >60.00)
Glucose, Bld: 84 mg/dL (ref 70–99)
POTASSIUM: 4.2 meq/L (ref 3.5–5.1)
Sodium: 139 mEq/L (ref 135–145)

## 2014-04-21 LAB — LIPASE: Lipase: 38 U/L (ref 11.0–59.0)

## 2014-04-21 LAB — TSH: TSH: 2.9 u[IU]/mL (ref 0.35–4.50)

## 2014-04-21 LAB — AMYLASE: Amylase: 47 U/L (ref 27–131)

## 2014-04-22 ENCOUNTER — Encounter: Payer: Self-pay | Admitting: Internal Medicine

## 2014-04-22 DIAGNOSIS — R109 Unspecified abdominal pain: Secondary | ICD-10-CM | POA: Insufficient documentation

## 2014-04-22 NOTE — Assessment & Plan Note (Signed)
Persistent, worsening abdominal discomfort, nausea, vomiting, decreased appetite and weight loss.  Will check cbc, met b, liver panel, amylase and lipase.  Check abdominal and pelvic ultrasound.  Wanted to start PPI.  She declined.  Did agree to start zantac 122m bid.  Discussed GI referral.  She cancelled her previous appt.  Wants to start with above testing first and then if persistent problems, will agree to referral.

## 2014-04-22 NOTE — Progress Notes (Signed)
Subjective:    Patient ID: Dawn Swanson, female    DOB: 1951-06-27, 63 y.o.   MRN: [id number]  Abdominal Pain  63 year old female with past history of GERD, fibrocystic breast disease and hypercholesterolemia who comes in today as a work in with concerns regarding some persistent intermittent nausea, abdominal discomfort, weight loss and intermittent emesis.  She has had intermittent problems with her stomach for a while now.  Appears to have worsened recently.  Over the last three weeks, she has noticed some persistent intermittent nausea.  Has been using peppermint oil with noted improvement.  She also has intermittent episodes of emesis.  States she will wake up with the nausea.  also has increased abdominal discomfort.  Diffuse.  No bowel change.  Some problems with what she describes as hemorrhoids.  Will flare intermittently.  Intermittent blood streaks on the tissue.  No other bleeding.  Some acid reflux at times.  Decreased appetite.  Not eating well.  Has lost weight.  States she drinks chocolate milk in the am.  This helps some with the nausea.  States if she leans over (sometimes), may aggravate the nausea.  Stomach feels hard after she eats.   No cardiac symptoms with increased activity or exertion.  She is also concerned regarding "uterine prolapse".  States is getting worse.  Wants referral.      Past Medical History  Diagnosis Date  . GERD (gastroesophageal reflux disease)   . Hypercholesterolemia   . Miscarriage   . Fibrocystic breast disease   . Contact lens/glasses fitting     wears contacts or glasses    Review of Systems  Gastrointestinal: Positive for abdominal pain.  Patient denies any headache, lightheadedness or dizziness.  No increased sinus pressure or congestion.  No chest pain or tightness.  No sob.   Nausea and vomiting as outlined.   Acid reflux as outlined.  Some nausea.   Increased abdominal discomfort as outlined.  Decreased appetite.  Weight loss.  No bowel  change, such as diarrhea, constipation, or melana. Some streaks of blood on the tissues intermittently.   No urine change.  Uterine prolapse as outlined.        Objective:   Physical Exam  Filed Vitals:   04/20/14 1529  BP: 130/80  Pulse: 73  Temp: 98.4 F (36.9 C)   Blood pressure recheck:  36/78  63 year old female in no acute distress.   HEENT:  Nares- clear.  Oropharynx - without lesions. NECK:  Supple.  Nontender.  No audible bruit.  HEART:  Appears to be regular. LUNGS:  No crackles or wheezing audible.  Respirations even and unlabored.  RADIAL PULSE:  Equal bilaterally.     ABDOMEN:  Soft.  Minimal tenderness to palpation.  No rebound or guarding.   Bowel sounds present and normal.  No audible abdominal bruit.    EXTREMITIES:  No increased edema present.  DP pulses palpable and equal bilaterally.          Assessment & Plan:  GI.  Has persistent reflux.  W Colonoscopy 08/09/10 with diverticulosis.  Recommended follow up colonoscopy five years..  Was referred to GI for evaluation.  She previously cancelled.     CARDIOVASCULAR.  Asymptomatic.   INCREASED PSYCHOSOCIAL STRESSORS.  Feels she is handling things relatively well.  Follow.   HEALTH MAINTENANCE.  Physical 08/25/13.  PAP 08/25/13 - negative with negative HPV.   GI - as outlined.  Has wanted to hold on bone density.  Mammogram 02/18/13 - Birads II.  Needs f/u mammogram scheduled.    I spent 25 minutes with the patient and more than 50% of the time was spent in consultation regarding the above.

## 2014-04-22 NOTE — Assessment & Plan Note (Signed)
Low cholesterol diet and exercise.  Follow.  Declines medication.

## 2014-04-22 NOTE — Assessment & Plan Note (Signed)
Off omeprazole.  Have discussed at length wit her regarding the need to take a PPI regularly to control symptoms.  She declines restarting a PPI.  Did agree to zantac.  Zantac as directed.  Discussed GI referral.  She wants to hold at this time.  W/up as outlined above.

## 2014-04-22 NOTE — Assessment & Plan Note (Signed)
Nausea, vomiting, decreased appetite and weight loss as outlined.  W/up as outlined above.  Avoid foods that aggravate.  Further w/up pending results of abvoe.

## 2014-04-22 NOTE — Telephone Encounter (Signed)
Labs mailed to patient.

## 2014-04-28 ENCOUNTER — Ambulatory Visit: Payer: Self-pay | Admitting: Internal Medicine

## 2014-05-13 ENCOUNTER — Ambulatory Visit (INDEPENDENT_AMBULATORY_CARE_PROVIDER_SITE_OTHER): Payer: Commercial Managed Care - HMO | Admitting: Internal Medicine

## 2014-05-13 ENCOUNTER — Encounter: Payer: Self-pay | Admitting: Internal Medicine

## 2014-05-13 VITALS — BP 120/80 | HR 86 | Temp 98.5°F | Ht 66.0 in | Wt 171.2 lb

## 2014-05-13 DIAGNOSIS — K219 Gastro-esophageal reflux disease without esophagitis: Secondary | ICD-10-CM

## 2014-05-13 DIAGNOSIS — R1084 Generalized abdominal pain: Secondary | ICD-10-CM

## 2014-05-13 DIAGNOSIS — R160 Hepatomegaly, not elsewhere classified: Secondary | ICD-10-CM

## 2014-05-13 DIAGNOSIS — K769 Liver disease, unspecified: Secondary | ICD-10-CM

## 2014-05-13 NOTE — Progress Notes (Signed)
Pre visit review using our clinic review tool, if applicable. No additional management support is needed unless otherwise documented below in the visit note. 

## 2014-05-14 ENCOUNTER — Encounter: Payer: Self-pay | Admitting: Internal Medicine

## 2014-05-14 NOTE — Assessment & Plan Note (Signed)
Persistent abdominal discomfort, nausea and vomiting and weight loss.  Ultrasound with liver mass.  Will check MRI liver.  Further w/up pending results.  Hold on medication changes at this time.

## 2014-05-14 NOTE — Progress Notes (Signed)
Subjective:    Patient ID: Dawn Swanson, female    DOB: [birth year], 63 y.o.   MRN: [id number]  Abdominal Pain  63 year old female with past history of GERD, fibrocystic breast disease and hypercholesterolemia who comes in today for a scheduled follow up.  Was just recently seen as a work in with concerns regarding some persistent intermittent nausea, abdominal discomfort, weight loss and intermittent emesis.  She has had intermittent problems with her stomach for a while now.  Appears to have worsened recently.  She has noticed some persistent intermittent nausea.  Has been using peppermint oil with noted improvement.  She also has intermittent episodes of emesis.  Has only had one episode since her last visit here.   No significant bowel change.   Decreased appetite.  Not eating well.  Has lost weight.  Lose a couple of more pounds since her last visit.  States she drinks chocolate milk in the am.  See last note for more specific details.  This helps some with the nausea.   No cardiac symptoms with increased activity or exertion.  Breathing stable.     Past Medical History  Diagnosis Date  . GERD (gastroesophageal reflux disease)   . Hypercholesterolemia   . Miscarriage   . Fibrocystic breast disease   . Contact lens/glasses fitting     wears contacts or glasses    Current Outpatient Prescriptions on File Prior to Visit  Medication Sig Dispense Refill  . Multiple Vitamin (MULTIVITAMIN) tablet Take 1 tablet by mouth 2 (two) times daily.       No current facility-administered medications on file prior to visit.    Review of Systems  Gastrointestinal: Positive for abdominal pain.  Patient denies any headache, lightheadedness or dizziness.  No increased sinus pressure or congestion.  No chest pain or tightness.  No sob.   Nausea and vomiting as outlined.   No significant acid reflux reported today.  Did report zantac made her feel worse.   Some nausea.   Increased abdominal discomfort as  outlined.  Decreased appetite.  Weight loss.  No bowel change, such as diarrhea, constipation, or melana.   No urine change.   Had CT abdomen and pelvis that revealed mass left lobe liver.  MRI liver recommended.  We discussed this today.       Objective:   Physical Exam  Filed Vitals:   05/13/14 1236  BP: 120/80  Pulse: 86  Temp: 98.5 F (61.49 C)   63 year old female in no acute distress.   HEENT:  Oropharynx - without lesions.  Moist mucus membranes.  NECK:  Supple.  Nontender.   HEART:  Appears to be regular. LUNGS:  No crackles or wheezing audible.  Respirations even and unlabored.  RADIAL PULSE:  Equal bilaterally.     ABDOMEN:  Soft.  Minimal tenderness to palpation.  No rebound or guarding.   Bowel sounds present and normal.  No audible abdominal bruit.    EXTREMITIES:  No increased edema present.  DP pulses palpable and equal bilaterally.          Assessment & Plan:  GI.  Has persistent reflux.  W Colonoscopy 08/09/10 with diverticulosis.  Recommended follow up colonoscopy five years..  Was referred to GI for evaluation.  She previously cancelled.   Now with abnormal CT.  Will pursue MRI liver.    CARDIOVASCULAR.  Asymptomatic.   INCREASED PSYCHOSOCIAL STRESSORS.  Feels she is handling things relatively well.  Follow.  HEALTH MAINTENANCE.  Physical 08/25/13.  PAP 08/25/13 - negative with negative HPV.   GI - as outlined.  Has wanted to hold on bone density.  Mammogram 02/18/13 - Birads II.  Needs f/u mammogram scheduled.

## 2014-05-14 NOTE — Assessment & Plan Note (Signed)
Off omeprazole.  Have discussed at length wit her regarding the need to take a PPI regularly to control symptoms.  She declined restarting a PPI.  Did agree to zantac last visit.  Reports zantac makes her feel worse.  Have discussed GI referral.  She had wanted to hold on referral.  Check MRI liver as outlined.

## 2014-05-28 ENCOUNTER — Telehealth: Payer: Self-pay | Admitting: *Deleted

## 2014-05-28 NOTE — Telephone Encounter (Signed)
Pt called states she can feel a lump that is sore to touch just below her breast bone.  She further states she was evaluated while in [place] by an RN and was told it is possibly a hernia.  Pt also states she is unable to lay on her side as she becomes nauseated.  Pt is requesting to be scheduled with GI as previously discussed.  Please advise

## 2014-05-28 NOTE — Telephone Encounter (Signed)
I do not mind the referral to GI, but per records she is scheduled to have MRI liver tomorrow.  I would like to see what this shows and then refer as appropriate.

## 2014-05-28 NOTE — Telephone Encounter (Signed)
Left details on voicemail per [facility]

## 2014-05-29 ENCOUNTER — Ambulatory Visit: Payer: Self-pay | Admitting: Internal Medicine

## 2014-06-01 ENCOUNTER — Encounter: Payer: Self-pay | Admitting: Internal Medicine

## 2014-06-01 DIAGNOSIS — R110 Nausea: Secondary | ICD-10-CM

## 2014-06-01 DIAGNOSIS — R634 Abnormal weight loss: Secondary | ICD-10-CM

## 2014-06-01 DIAGNOSIS — K219 Gastro-esophageal reflux disease without esophagitis: Secondary | ICD-10-CM

## 2014-06-01 DIAGNOSIS — R160 Hepatomegaly, not elsewhere classified: Secondary | ICD-10-CM

## 2014-06-01 NOTE — Telephone Encounter (Signed)
Pt notified of MRI results and need for GI referral.  Order placed for GI referral.

## 2014-06-01 NOTE — Addendum Note (Signed)
Addended by: Alisa Graff on: 06/01/2014 05:11 PM   Modules accepted: Orders

## 2014-06-05 ENCOUNTER — Encounter: Payer: Self-pay | Admitting: Internal Medicine

## 2014-06-08 ENCOUNTER — Telehealth: Payer: Self-pay | Admitting: Internal Medicine

## 2014-06-08 NOTE — Telephone Encounter (Signed)
See below

## 2014-06-08 NOTE — Telephone Encounter (Signed)
The patient was diagnosed with a liver mass at [facility] ER over the weekend . She stated that it is a chance of it rupturing . She will speak with Brent Bulla first. She may call back to speak with Dr. Nicki Reaper.

## 2014-06-08 NOTE — Telephone Encounter (Signed)
Called pt.  She was admitted (observation) at [facility].  Discharge home today with plans for them to f/u with her this week regarding her liver mass (hemangioma).  States the MDs at [facility] are meeting Wednesday to discuss her case.

## 2014-06-10 ENCOUNTER — Encounter: Payer: Self-pay | Admitting: Internal Medicine

## 2014-06-10 ENCOUNTER — Telehealth: Payer: Self-pay | Admitting: Internal Medicine

## 2014-06-10 DIAGNOSIS — N83202 Unspecified ovarian cyst, left side: Secondary | ICD-10-CM | POA: Insufficient documentation

## 2014-06-10 NOTE — Telephone Encounter (Signed)
Opened in error

## 2014-06-17 ENCOUNTER — Encounter: Payer: Self-pay | Admitting: Internal Medicine

## 2014-07-22 ENCOUNTER — Other Ambulatory Visit: Payer: Self-pay | Admitting: Internal Medicine

## 2014-07-27 ENCOUNTER — Telehealth: Payer: Self-pay | Admitting: Internal Medicine

## 2014-07-27 NOTE — Telephone Encounter (Signed)
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 The patient is wanting a second opinion. In order to get that second opinion she will need a referral from her primary . She is wanting a referral to Dr. Alcide Clever in [place] , [place] phone # [phone].

## 2014-07-28 NOTE — Telephone Encounter (Signed)
I do not mind making the referral, but I need to know what type of doctor - Dr Alcide Clever (she requested).  Is he oncology or gastroenterology?  Just let me know and will place the order.

## 2014-07-28 NOTE — Telephone Encounter (Signed)
I spoke with pt, she is unsure what type of MD Dr Alcide Clever is, she says she believes they are a [facility].  I called the office, the recording says [facility].

## 2014-07-28 NOTE — Telephone Encounter (Signed)
Phone message copied and information given to [place].  Will let me know about referral.

## 2015-01-25 ENCOUNTER — Encounter
Admission: RE | Admit: 2015-01-25 | Discharge: 2015-01-25 | Disposition: A | Discharge status: Home / Self Care | Payer: Commercial Managed Care - HMO | Source: Ambulatory Visit | Attending: Obstetrics & Gynecology | Admitting: Obstetrics & Gynecology

## 2015-01-25 DIAGNOSIS — Z01810 Encounter for preprocedural cardiovascular examination: Secondary | ICD-10-CM | POA: Insufficient documentation

## 2015-01-25 DIAGNOSIS — N813 Complete uterovaginal prolapse: Secondary | ICD-10-CM | POA: Insufficient documentation

## 2015-01-25 DIAGNOSIS — Z01812 Encounter for preprocedural laboratory examination: Secondary | ICD-10-CM | POA: Insufficient documentation

## 2015-01-25 DIAGNOSIS — N8111 Cystocele, midline: Secondary | ICD-10-CM | POA: Diagnosis not present

## 2015-01-25 HISTORY — DX: Malignant (primary) neoplasm, unspecified: C80.1

## 2015-01-25 LAB — BASIC METABOLIC PANEL
ANION GAP: 7 (ref 5–15)
BUN: 21 mg/dL — ABNORMAL HIGH (ref 6–20)
CO2: 28 mmol/L (ref 22–32)
Calcium: 9.6 mg/dL (ref 8.9–10.3)
Chloride: 102 mmol/L (ref 101–111)
Creatinine, Ser: 0.58 mg/dL (ref 0.44–1.00)
Glucose, Bld: 77 mg/dL (ref 65–99)
POTASSIUM: 3.9 mmol/L (ref 3.5–5.1)
Sodium: 137 mmol/L (ref 135–145)

## 2015-01-25 LAB — CBC
HCT: 39.4 % (ref 35.0–47.0)
Hemoglobin: 13.2 g/dL (ref 12.0–16.0)
MCH: 31.6 pg (ref 26.0–34.0)
MCHC: 33.4 g/dL (ref 32.0–36.0)
MCV: 94.8 fL (ref 80.0–100.0)
Platelets: 195 10*3/uL (ref 150–440)
RBC: 4.16 MIL/uL (ref 3.80–5.20)
RDW: 13.6 % (ref 11.5–14.5)
WBC: 4.8 10*3/uL (ref 3.6–11.0)

## 2015-01-25 LAB — APTT: aPTT: 28 seconds (ref 24–36)

## 2015-01-25 LAB — TYPE AND SCREEN
ABO/RH(D): AB POS
Antibody Screen: NEGATIVE

## 2015-01-25 LAB — PROTIME-INR
INR: 1.01
Prothrombin Time: 13.5 seconds (ref 11.4–15.0)

## 2015-01-25 NOTE — Patient Instructions (Addendum)
  Your procedure is scheduled on: Tuesday 02/09/15 Report to Day Surgery. Medical mall Entrance To find out your arrival time please call [phone] between 1PM - 3PM on Monday 02/08/15.  Remember: Instructions that are not followed completely may result in serious medical risk, up to and including death, or upon the discretion of your surgeon and anesthesiologist your surgery may need to be rescheduled.    __x__ 1. Do not eat food or drink liquids after midnight. No gum chewing or hard candies.     __x__ 2. No Alcohol for 24 hours before or after surgery.   ____ 3. Bring all medications with you on the day of surgery if instructed.    __x__ 4. Notify your doctor if there is any change in your medical condition     (cold, fever, infections).     Do not wear jewelry, make-up, hairpins, clips or nail polish.  Do not wear lotions, powders, or perfumes.  Do not shave 48 hours prior to surgery. Men may shave face and neck.  Do not bring valuables to the hospital.    [facility] is not responsible for any belongings or valuables.               Contacts, dentures or bridgework may not be worn into surgery.  Leave your suitcase in the car. After surgery it may be brought to your room.  For patients admitted to the hospital, discharge time is determined by your                treatment team.   Patients discharged the day of surgery will not be allowed to drive home.   Please read over the following fact sheets that you were given:   Surgical Site Infection Prevention   ____ Take these medicines the morning of surgery with A SIP OF WATER:    1.   2.   3.   4.  5.  6.  ____ Fleet Enema (as directed)   __x_ Use CHG Soap as directed S[employer]  ____ Use inhalers on the day of surgery  ____ Stop metformin 2 days prior to surgery    ____ Take 1/2 of usual insulin dose the night before surgery and none on the morning of surgery.   ____ Stop Coumadin/Plavix/aspirin on   ____ Stop  Anti-inflammatories on    ____ Stop supplements until after surgery.    ____ Bring C-Pap to the hospital.

## 2015-02-06 DIAGNOSIS — N819 Female genital prolapse, unspecified: Secondary | ICD-10-CM | POA: Diagnosis present

## 2015-02-06 DIAGNOSIS — N8111 Cystocele, midline: Secondary | ICD-10-CM | POA: Diagnosis present

## 2015-02-09 ENCOUNTER — Ambulatory Visit: Payer: Commercial Managed Care - HMO | Admitting: Anesthesiology

## 2015-02-09 ENCOUNTER — Encounter: Payer: Self-pay | Admitting: *Deleted

## 2015-02-09 ENCOUNTER — Encounter
Admission: RE | Disposition: A | Discharge status: Home / Self Care | Payer: Self-pay | Source: Ambulatory Visit | Attending: Obstetrics & Gynecology

## 2015-02-09 ENCOUNTER — Observation Stay
Admission: RE | Admit: 2015-02-09 | Discharge: 2015-02-10 | Disposition: A | Discharge status: Home / Self Care | Payer: Commercial Managed Care - HMO | Source: Ambulatory Visit | Attending: Obstetrics & Gynecology | Admitting: Obstetrics & Gynecology

## 2015-02-09 DIAGNOSIS — C169 Malignant neoplasm of stomach, unspecified: Secondary | ICD-10-CM | POA: Insufficient documentation

## 2015-02-09 DIAGNOSIS — Z803 Family history of malignant neoplasm of breast: Secondary | ICD-10-CM | POA: Insufficient documentation

## 2015-02-09 DIAGNOSIS — Z800 Family history of malignant neoplasm of digestive organs: Secondary | ICD-10-CM | POA: Diagnosis not present

## 2015-02-09 DIAGNOSIS — R338 Other retention of urine: Secondary | ICD-10-CM | POA: Diagnosis not present

## 2015-02-09 DIAGNOSIS — Z87898 Personal history of other specified conditions: Secondary | ICD-10-CM

## 2015-02-09 DIAGNOSIS — N8189 Other female genital prolapse: Secondary | ICD-10-CM | POA: Diagnosis not present

## 2015-02-09 DIAGNOSIS — N819 Female genital prolapse, unspecified: Secondary | ICD-10-CM | POA: Diagnosis present

## 2015-02-09 DIAGNOSIS — Z880 Allergy status to penicillin: Secondary | ICD-10-CM | POA: Insufficient documentation

## 2015-02-09 DIAGNOSIS — Z9049 Acquired absence of other specified parts of digestive tract: Secondary | ICD-10-CM | POA: Diagnosis not present

## 2015-02-09 DIAGNOSIS — Z79899 Other long term (current) drug therapy: Secondary | ICD-10-CM | POA: Diagnosis not present

## 2015-02-09 DIAGNOSIS — Z807 Family history of other malignant neoplasms of lymphoid, hematopoietic and related tissues: Secondary | ICD-10-CM | POA: Insufficient documentation

## 2015-02-09 DIAGNOSIS — Z841 Family history of disorders of kidney and ureter: Secondary | ICD-10-CM | POA: Diagnosis not present

## 2015-02-09 DIAGNOSIS — N8111 Cystocele, midline: Secondary | ICD-10-CM | POA: Diagnosis present

## 2015-02-09 DIAGNOSIS — C787 Secondary malignant neoplasm of liver and intrahepatic bile duct: Secondary | ICD-10-CM | POA: Insufficient documentation

## 2015-02-09 DIAGNOSIS — Z90710 Acquired absence of both cervix and uterus: Secondary | ICD-10-CM | POA: Diagnosis present

## 2015-02-09 HISTORY — PX: SALPINGOOPHORECTOMY: SHX82

## 2015-02-09 HISTORY — PX: VAGINAL HYSTERECTOMY: SHX2639

## 2015-02-09 HISTORY — PX: CYSTOCELE REPAIR: SHX163

## 2015-02-09 LAB — TYPE AND SCREEN
ABO/RH(D): AB POS
Antibody Screen: NEGATIVE

## 2015-02-09 SURGERY — HYSTERECTOMY, VAGINAL
Anesthesia: General | Wound class: Clean Contaminated

## 2015-02-09 MED ORDER — LACTATED RINGERS IV SOLN
INTRAVENOUS | Status: DC
  Administered 2015-02-09 (×2): via INTRAVENOUS

## 2015-02-09 MED ORDER — CLINDAMYCIN PHOSPHATE 900 MG/50ML IV SOLN
INTRAVENOUS | Status: AC
  Administered 2015-02-09: 900 mg via INTRAVENOUS
  Filled 2015-02-09: qty 50

## 2015-02-09 MED ORDER — LIDOCAINE-EPINEPHRINE (PF) 1 %-1:200000 IJ SOLN
INTRAMUSCULAR | Status: AC
  Filled 2015-02-09: qty 60

## 2015-02-09 MED ORDER — CLINDAMYCIN PHOSPHATE 900 MG/50ML IV SOLN
900.0000 mg | Freq: Once | INTRAVENOUS | Status: AC
  Administered 2015-02-09: 900 mg via INTRAVENOUS

## 2015-02-09 MED ORDER — GLYCOPYRROLATE 0.2 MG/ML IJ SOLN
INTRAMUSCULAR | Status: DC | PRN
  Administered 2015-02-09: 0.6 mg via INTRAVENOUS
  Administered 2015-02-09: 0.2 mg via INTRAVENOUS

## 2015-02-09 MED ORDER — OXYCODONE-ACETAMINOPHEN 5-325 MG PO TABS: 1.0000 | ORAL_TABLET | ORAL | Status: DC | PRN

## 2015-02-09 MED ORDER — ACETAMINOPHEN 325 MG PO TABS: 650.0000 mg | ORAL_TABLET | ORAL | Status: DC | PRN

## 2015-02-09 MED ORDER — FAMOTIDINE 20 MG PO TABS
ORAL_TABLET | ORAL | Status: AC
  Administered 2015-02-09: 20 mg via ORAL
  Filled 2015-02-09: qty 1

## 2015-02-09 MED ORDER — DOCUSATE SODIUM 100 MG PO CAPS
100.0000 mg | ORAL_CAPSULE | Freq: Two times a day (BID) | ORAL | Status: DC
  Administered 2015-02-09: 100 mg via ORAL
  Filled 2015-02-09: qty 1

## 2015-02-09 MED ORDER — SULFANILAMIDE 15 % VA CREA
TOPICAL_CREAM | VAGINAL | Status: AC
  Filled 2015-02-09: qty 120

## 2015-02-09 MED ORDER — ONDANSETRON HCL 4 MG/2ML IJ SOLN
INTRAMUSCULAR | Status: DC | PRN
  Administered 2015-02-09: 4 mg via INTRAVENOUS

## 2015-02-09 MED ORDER — LIDOCAINE-EPINEPHRINE 1 %-1:100000 IJ SOLN
INTRAMUSCULAR | Status: AC
  Filled 2015-02-09: qty 2

## 2015-02-09 MED ORDER — FAMOTIDINE 20 MG PO TABS
20.0000 mg | ORAL_TABLET | Freq: Once | ORAL | Status: AC
  Administered 2015-02-09: 20 mg via ORAL

## 2015-02-09 MED ORDER — KETOROLAC TROMETHAMINE 30 MG/ML IJ SOLN: 30.0000 mg | Freq: Four times a day (QID) | INTRAMUSCULAR | Status: DC

## 2015-02-09 MED ORDER — PROPOFOL 10 MG/ML IV BOLUS
INTRAVENOUS | Status: DC | PRN
  Administered 2015-02-09: 120 mg via INTRAVENOUS

## 2015-02-09 MED ORDER — MORPHINE SULFATE 2 MG/ML IJ SOLN: 1.0000 mg | INTRAMUSCULAR | Status: DC | PRN

## 2015-02-09 MED ORDER — NEOSTIGMINE METHYLSULFATE 10 MG/10ML IV SOLN
INTRAVENOUS | Status: DC | PRN
  Administered 2015-02-09: 3 mg via INTRAVENOUS

## 2015-02-09 MED ORDER — LIDOCAINE-EPINEPHRINE 1 %-1:100000 IJ SOLN
INTRAMUSCULAR | Status: DC | PRN
  Administered 2015-02-09 (×2): 5 mL

## 2015-02-09 MED ORDER — SIMETHICONE 80 MG PO CHEW: 80.0000 mg | CHEWABLE_TABLET | Freq: Four times a day (QID) | ORAL | Status: DC | PRN

## 2015-02-09 MED ORDER — ONDANSETRON HCL 4 MG/2ML IJ SOLN: 4.0000 mg | Freq: Once | INTRAMUSCULAR | Status: DC | PRN

## 2015-02-09 MED ORDER — PHENYLEPHRINE HCL 10 MG/ML IJ SOLN
INTRAMUSCULAR | Status: DC | PRN
  Administered 2015-02-09 (×2): 100 ug via INTRAVENOUS
  Administered 2015-02-09: 200 ug via INTRAVENOUS

## 2015-02-09 MED ORDER — LACTATED RINGERS IV SOLN
INTRAVENOUS | Status: DC
Start: 2015-02-09 — End: 2015-02-10
  Administered 2015-02-09 – 2015-02-10 (×2): via INTRAVENOUS

## 2015-02-09 MED ORDER — ONDANSETRON HCL 4 MG PO TABS: 4.0000 mg | ORAL_TABLET | Freq: Four times a day (QID) | ORAL | Status: DC | PRN

## 2015-02-09 MED ORDER — LIDOCAINE HCL (PF) 1 % IJ SOLN
INTRAMUSCULAR | Status: AC
  Filled 2015-02-09: qty 30

## 2015-02-09 MED ORDER — SULFANILAMIDE 15 % VA CREA
TOPICAL_CREAM | VAGINAL | Status: DC | PRN
  Administered 2015-02-09: 1 via VAGINAL

## 2015-02-09 MED ORDER — ROCURONIUM BROMIDE 100 MG/10ML IV SOLN
INTRAVENOUS | Status: DC | PRN
  Administered 2015-02-09: 40 mg via INTRAVENOUS
  Administered 2015-02-09: 10 mg via INTRAVENOUS

## 2015-02-09 MED ORDER — FENTANYL CITRATE (PF) 100 MCG/2ML IJ SOLN
INTRAMUSCULAR | Status: AC
  Administered 2015-02-09: 25 ug via INTRAVENOUS
  Filled 2015-02-09: qty 2

## 2015-02-09 MED ORDER — MIDAZOLAM HCL 2 MG/2ML IJ SOLN
INTRAMUSCULAR | Status: DC | PRN
  Administered 2015-02-09: 2 mg via INTRAVENOUS

## 2015-02-09 MED ORDER — KETOROLAC TROMETHAMINE 30 MG/ML IJ SOLN
15.0000 mg | Freq: Four times a day (QID) | INTRAMUSCULAR | Status: DC
  Administered 2015-02-10: 15 mg via INTRAVENOUS
  Filled 2015-02-09: qty 1

## 2015-02-09 MED ORDER — KETOROLAC TROMETHAMINE 60 MG/2ML IM SOLN
INTRAMUSCULAR | Status: AC
  Administered 2015-02-09: 30 mg
  Filled 2015-02-09: qty 2

## 2015-02-09 MED ORDER — KETOROLAC TROMETHAMINE 30 MG/ML IJ SOLN
30.0000 mg | Freq: Four times a day (QID) | INTRAMUSCULAR | Status: DC
  Administered 2015-02-09: 30 mg via INTRAVENOUS
  Filled 2015-02-09: qty 1

## 2015-02-09 MED ORDER — ONDANSETRON HCL 4 MG/2ML IJ SOLN: 4.0000 mg | Freq: Four times a day (QID) | INTRAMUSCULAR | Status: DC | PRN

## 2015-02-09 MED ORDER — LIDOCAINE HCL (PF) 1 % IJ SOLN
INTRAMUSCULAR | Status: AC
Start: 2015-02-09 — End: 2015-02-09
  Filled 2015-02-09: qty 60

## 2015-02-09 MED ORDER — SENNOSIDES-DOCUSATE SODIUM 8.6-50 MG PO TABS: 1.0000 | ORAL_TABLET | Freq: Every evening | ORAL | Status: DC | PRN

## 2015-02-09 MED ORDER — FENTANYL CITRATE (PF) 100 MCG/2ML IJ SOLN
25.0000 ug | INTRAMUSCULAR | Status: DC | PRN
  Administered 2015-02-09 (×4): 25 ug via INTRAVENOUS

## 2015-02-09 MED ORDER — LIDOCAINE HCL (PF) 1 % IJ SOLN
INTRAMUSCULAR | Status: AC
  Filled 2015-02-09: qty 2

## 2015-02-09 MED ORDER — FENTANYL CITRATE (PF) 100 MCG/2ML IJ SOLN
INTRAMUSCULAR | Status: DC | PRN
  Administered 2015-02-09: 50 ug via INTRAVENOUS
  Administered 2015-02-09: 100 ug via INTRAVENOUS
  Administered 2015-02-09: 50 ug via INTRAVENOUS

## 2015-02-09 MED ORDER — LIDOCAINE HCL (CARDIAC) 20 MG/ML IV SOLN
INTRAVENOUS | Status: DC | PRN
  Administered 2015-02-09: 80 mg via INTRAVENOUS

## 2015-02-09 SURGICAL SUPPLY — 46 items
BAG URO DRAIN 2000ML W/SPOUT (MISCELLANEOUS) ×4 IMPLANT
BLADE SURG 15 STRL LF DISP TIS (BLADE) ×2 IMPLANT
BLADE SURG 15 STRL SS (BLADE) ×2
BLADE SURG SZ10 CARB STEEL (BLADE) ×4 IMPLANT
CANISTER SUCT 1200ML W/VALVE (MISCELLANEOUS) ×4 IMPLANT
CATH FOLEY 2WAY  5CC 16FR (CATHETERS) ×2
CATH URTH 16FR FL 2W BLN LF (CATHETERS) ×2 IMPLANT
DRAPE PERI LITHO V/GYN (MISCELLANEOUS) ×4 IMPLANT
DRAPE SHEET LG 3/4 BI-LAMINATE (DRAPES) ×4 IMPLANT
DRAPE UNDER BUTTOCK W/FLU (DRAPES) ×4 IMPLANT
ELECT CAUTERY BLADE 6.4 (BLADE) ×4 IMPLANT
GAUZE PACK 2X3YD (MISCELLANEOUS) ×4 IMPLANT
GLOVE BIO SURGEON STRL SZ8 (GLOVE) ×8 IMPLANT
GOWN STRL REUS W/ TWL LRG LVL3 (GOWN DISPOSABLE) ×6 IMPLANT
GOWN STRL REUS W/ TWL XL LVL3 (GOWN DISPOSABLE) ×2 IMPLANT
GOWN STRL REUS W/TWL LRG LVL3 (GOWN DISPOSABLE) ×6
GOWN STRL REUS W/TWL XL LVL3 (GOWN DISPOSABLE) ×2
KIT RM TURNOVER CYSTO AR (KITS) ×4 IMPLANT
KIT RM TURNOVER STRD PROC AR (KITS) ×4 IMPLANT
LABEL OR SOLS (LABEL) ×4 IMPLANT
NDL HPO THNWL 1X22GA REG BVL (NEEDLE) ×2 IMPLANT
NDL MAYO CATGUT SZ4 (NEEDLE) ×4 IMPLANT
NEEDLE SAFETY 22GX1 (NEEDLE) ×2
NEEDLE SPNL 22GX3.5 QUINCKE BK (NEEDLE) ×4 IMPLANT
NS IRRIG 500ML POUR BTL (IV SOLUTION) ×4 IMPLANT
PACK BASIN MINOR ARMC (MISCELLANEOUS) ×4 IMPLANT
PAD GROUND ADULT SPLIT (MISCELLANEOUS) ×4 IMPLANT
PAD OB MATERNITY 4.3X12.25 (PERSONAL CARE ITEMS) ×4 IMPLANT
PAD PREP 24X41 OB/GYN DISP (PERSONAL CARE ITEMS) ×4 IMPLANT
SOL PREP PVP 2OZ (MISCELLANEOUS) ×4
SOLUTION PREP PVP 2OZ (MISCELLANEOUS) ×2 IMPLANT
SPONGE XRAY 4X4 16PLY STRL (MISCELLANEOUS) ×4 IMPLANT
STRAP SAFETY BODY (MISCELLANEOUS) ×4 IMPLANT
SUT ETHIBOND NAB CT1 #1 30IN (SUTURE) ×16 IMPLANT
SUT VIC AB 0 CT1 27 (SUTURE) ×8
SUT VIC AB 0 CT1 27XCR 8 STRN (SUTURE) ×8 IMPLANT
SUT VIC AB 0 CT1 36 (SUTURE) ×4 IMPLANT
SUT VIC AB 1 CT1 36 (SUTURE) ×4 IMPLANT
SUT VIC AB 2-0 CT1 (SUTURE) ×8 IMPLANT
SUT VIC AB 2-0 CT1 27 (SUTURE) ×2
SUT VIC AB 2-0 CT1 TAPERPNT 27 (SUTURE) ×2 IMPLANT
SUT VIC AB 3-0 SH 27 (SUTURE) ×2
SUT VIC AB 3-0 SH 27X BRD (SUTURE) ×2 IMPLANT
SYR BULB IRRIG 60ML STRL (SYRINGE) ×4 IMPLANT
SYR CONTROL 10ML (SYRINGE) ×4 IMPLANT
SYRINGE 10CC LL (SYRINGE) ×4 IMPLANT

## 2015-02-09 NOTE — Progress Notes (Signed)
Day of Surgery Procedure(s) (LRB): HYSTERECTOMY VAGINAL/BSO (N/A) SALPINGO OOPHORECTOMY (Bilateral) ANTERIOR REPAIR (CYSTOCELE) (N/A)  Subjective: Patient reports no pain or bleeding.    Objective: I have reviewed patient's vital signs, intake and output and medications.  Abd: Min T, ND Incision: none (vaginal; packing in place, no bleeding seen) Extr: no calf T, no edema  Assessment: s/p Procedure(s): HYSTERECTOMY VAGINAL/BSO (N/A) SALPINGO OOPHORECTOMY (Bilateral) ANTERIOR REPAIR (CYSTOCELE) (N/A): stable and tolerating diet  Plan: Advance diet Encourage ambulation Advance to PO medication  LOS: 0 days    Dawn Swanson PAUL 02/09/2015, 3:43 PM

## 2015-02-09 NOTE — Discharge Instructions (Signed)
Hysterectomy Information A hysterectomy is a surgery to remove your uterus. After surgery, you will no longer have periods. Also, you will not be able to get pregnant.  REASONS FOR THIS SURGERY  You have bleeding that is not normal and keeps coming back.  You have lasting (chronic) lower belly (pelvic) pain.  You have a lasting infection.  The lining of your uterus grows outside your uterus.  The lining of your uterus grows in the muscle of your uterus.  Your uterus falls down into your vagina.  You have a growth in your uterus that causes problems.  You have cells that could turn into cancer (precancerous cells).  You have cancer of the uterus or cervix. TYPES  There are 3 types of hysterectomies. Depending on the type, the surgery will:  Remove the top part of the uterus only.  Remove the uterus and the cervix.  Remove the uterus, cervix, and tissue that holds the uterus in place in the lower belly. WAYS A HYSTERECTOMY CAN BE PERFORMED There are 5 ways this surgery can be performed.   A cut (incision) is made in the belly (abdomen). The uterus is taken out through the cut.  A cut is made in the vagina. The uterus is taken out through the cut.  Three or four cuts are made in the belly. A surgical device with a camera is put through one of the cuts. The uterus is cut into small pieces. The uterus is taken out through the cuts or the vagina.  Three or four cuts are made in the belly. A surgical device with a camera is put through one of the cuts. The uterus is taken out through the vagina.  Three or four cuts are made in the belly. A surgical device that is controlled by a computer makes a visual image. The device helps the surgeon control the surgical tools. The uterus is cut into small pieces. The pieces are taken out through the cuts or through the vagina. WHAT TO EXPECT AFTER THE SURGERY  You will be given pain medicine.  You will need help at home for 3-5 days after  surgery.  You will need to see your doctor in 2-4 weeks after surgery.  You may get hot flashes, have night sweats, and have trouble sleeping.  You may need to have Pap tests in the future if your surgery was related to cancer. Talk to your doctor. It is still good to have regular exams. Document Released: 10/30/2011 Document Revised: 05/28/2013 Document Reviewed: 04/14/2013 [facility] Patient Information 2015 [place], [place]. This information is not intended to replace advice given to you by your health care provider. Make sure you discuss any questions you have with your health care provider.

## 2015-02-09 NOTE — Anesthesia Preprocedure Evaluation (Signed)
Anesthesia Evaluation  Patient identified by MRN, date of birth, ID band Patient awake    Reviewed: Allergy & Precautions, NPO status , Patient's Chart, lab work & pertinent test results  History of Anesthesia Complications Negative for: history of anesthetic complications  Airway Mallampati: II       Dental  (+) Teeth Intact   Pulmonary neg pulmonary ROS,    Pulmonary exam normal       Cardiovascular negative cardio ROS Normal cardiovascular exam    Neuro/Psych negative neurological ROS  negative psych ROS   GI/Hepatic negative GI ROS, Neg liver ROS,   Endo/Other  negative endocrine ROS  Renal/GU negative Renal ROS  negative genitourinary   Musculoskeletal negative musculoskeletal ROS (+)   Abdominal Normal abdominal exam  (+)   Peds negative pediatric ROS (+)  Hematology   Anesthesia Other Findings   Reproductive/Obstetrics negative OB ROS                             Anesthesia Physical Anesthesia Plan  ASA: III  Anesthesia Plan: General and Spinal   Post-op Pain Management:    Induction: Intravenous  Airway Management Planned: Natural Airway and LMA  Additional Equipment:   Intra-op Plan:   Post-operative Plan: Extubation in OR  Informed Consent: I have reviewed the patients History and Physical, chart, labs and discussed the procedure including the risks, benefits and alternatives for the proposed anesthesia with the patient or authorized representative who has indicated his/her understanding and acceptance.     Plan Discussed with: CRNA  Anesthesia Plan Comments:         Anesthesia Quick Evaluation

## 2015-02-09 NOTE — Anesthesia Postprocedure Evaluation (Signed)
  Anesthesia Post-op Note  Patient: Dawn Swanson  Procedure(s) Performed: Procedure(s): HYSTERECTOMY VAGINAL/BSO (N/A) SALPINGO OOPHORECTOMY (Bilateral) ANTERIOR REPAIR (CYSTOCELE) (N/A)  Anesthesia type:General, Spinal  Patient location: PACU  Post pain: Pain level controlled  Post assessment: Post-op Vital signs reviewed, Patient's Cardiovascular Status Stable, Respiratory Function Stable, Patent Airway and No signs of Nausea or vomiting  Post vital signs: Reviewed and stable  Last Vitals:  Filed Vitals:   02/09/15 1244  BP: 106/65  Pulse: 63  Temp:   Resp: 10    Level of consciousness: awake, alert  and patient cooperative  Complications: No apparent anesthesia complications

## 2015-02-09 NOTE — H&P (Signed)
History and Physical Interval Note:  02/09/2015 8:58 AM  Dawn Swanson  has presented today for surgery, with the diagnosis of PELVIC ORGAN PROLAPSE  The various methods of treatment have been discussed with the patient and family. After consideration of risks, benefits and other options for treatment, the patient has consented to  Procedure(s): HYSTERECTOMY VAGINAL/BSO (N/A) SALPINGO OOPHORECTOMY (Bilateral) ANTERIOR REPAIR (CYSTOCELE) (N/A) as a surgical intervention .   The patient's history has been reviewed, patient examined, no change in status, stable for surgery.  Pt has the following beta blocker history-  Not taking Beta Blocker.  I have reviewed the patient's chart and labs.  Questions were answered to the patient's satisfaction.    HARRIS,ROBERT Eddie Dibbles

## 2015-02-09 NOTE — Op Note (Signed)
Preoperative diagnosis: Pelvic Organ Prolapse  Postoperative diagnosis: Same  Procedure: Transvaginal hysterectomy   Surgeon: Barnett Applebaum, M.D.  Assistant: Sheryn Bison, MD  Anesthesia: general  Findings: Small prolapsed (complete)uterus, Atrophic ovaries with left ovarian cyst noted.  Estimated blood loss: 50 cc  Specimen: Uterus to pathology   Disposition: Returned to recovery unit in stable condition  Procedure: Patient was taken to the OR where she was placed in dorsal lithotomy in [place]. She was prepped and draped in the usual sterile fashion. A timeout was performed. Foley is placed into bladder. A speculum was placed inside the vagina. The cervix was visualized and grasped with a tenaculum. 1% lidocaine with epinephrine were injected paracervically. A bovie was used to make a circumferential incision around the vagina. An opened sponge was used to dissect the vagina off the cervix. The posterior peritoneum was entered sharply with Mayo scissors.  The anterior peritoneal cavity was entered sharply with careful dissection of the bladder off the underlying cervix. A Heaney clamp was used to clamp first the left uterosacral ligament and cardinal which was then cut and Haney suture ligated with 0 Vicryl stitch, the stitch was held and later attached to the vaginal mucosa. Similarly the right uterosacral ligament was clamped cut and suture ligated.  Sequential clamping, transecting and suture ligating up the broad to the uterine arteries were taken until the tubo-ovarian pedicles were encountered. The uterus was then amputated. The left utero-ovarian pedicle grasped with a Heaney clamp. The right utero-ovarian pedicle was similarly grasped with the Heaney clamp. The right left ovaries were easily visualized. The left ovary and tube were grasped with Babcock clamp and a Heaney clamp placed behind this. Tube and ovary were removed and the IP ligated with a free tie followed by suture  ligature. The right tube and ovary were similarly grasped with a Babcock clamp and a Heaney clamp used to clamp behind this. The tube and ovary were removed on the side and the IP was ligated with a free tie followed by suture ligature. Inspection of all pedicles revealed adequate hemostasis.  The peritoneum was then closed with a running pursestring suture of 0 Vicryl. The uterosacrals were plicated using a 1-0 Ethibond suture.  Vagina is irrigated. Anterior colporrhaphy is performed.  Allis clamps are placed along the anterior vaginal wall, lidocaine is used to infiltrate the plane, and incision is made midline vertical.  Endopelvic fascia is dissected free of vaginal mucosa.  Fascia is plicated w interrupted vicryl sutures.  Excess mucosa is excised.  Vaginal incision is closed with a running locking Vicryl suture, to incoprate the hysterectomy incision as well, with care taken to incorporate the uterosacral pedicles. Excellent hemostasis was noted at the end of the case. The vaginal cuff was inspected there was minimal bleeding noted.  Packing gauze w Premarin cream is placed. A Foley catheter is left in  place inside her bladder. Clear, yellow urine was noted. All instrument needle and lap counts were correct x 2. Patient was awakened taken to recovery room in stable condition.  Hoyt Koch, MD 02/09/2015, 11:49 AM

## 2015-02-09 NOTE — Anesthesia Procedure Notes (Signed)
Procedure Name: Intubation Performed by: Leander Rams Pre-anesthesia Checklist: Patient identified, Emergency Drugs available, Suction available, Patient being monitored and Timeout performed Patient Re-evaluated:Patient Re-evaluated prior to inductionOxygen Delivery Method: Circle system utilized Preoxygenation: Pre-oxygenation with 100% oxygen Intubation Type: IV induction Ventilation: Mask ventilation without difficulty Laryngoscope Size: Mac and 3 Grade View: Grade II Tube type: Oral Tube size: 7.0 mm Number of attempts: 1 Airway Equipment and Method: Stylet Placement Confirmation: ETT inserted through vocal cords under direct vision Secured at: 21 cm Tube secured with: Tape Dental Injury: Teeth and Oropharynx as per pre-operative assessment  Difficulty Due To: Difficult Airway- due to anterior larynx

## 2015-02-09 NOTE — Transfer of Care (Signed)
Immediate Anesthesia Transfer of Care Note  Patient: Dawn Swanson  Procedure(s) Performed: Procedure(s): HYSTERECTOMY VAGINAL/BSO (N/A) SALPINGO OOPHORECTOMY (Bilateral) ANTERIOR REPAIR (CYSTOCELE) (N/A)  Patient Location: PACU  Anesthesia Type:General  Level of Consciousness: awake  Airway & Oxygen Therapy: Patient Spontanous Breathing  Post-op Assessment: Report given to RN  Post vital signs: stable  Last Vitals:  Filed Vitals:   02/09/15 1144  BP: 119/69  Pulse: 75  Temp: 36.7 C  Resp: 12    Complications: No apparent anesthesia complications

## 2015-02-09 NOTE — Progress Notes (Signed)
Pt has PICC line in left arm; please get BPs and lab draws from right arm; pt had PICC line placed in "february" of 2016; pt and family have been taught how to change the dressing; the dressing was changed at the pt's home on 02-08-2015

## 2015-02-10 DIAGNOSIS — Z87898 Personal history of other specified conditions: Secondary | ICD-10-CM

## 2015-02-10 DIAGNOSIS — N8189 Other female genital prolapse: Secondary | ICD-10-CM | POA: Diagnosis not present

## 2015-02-10 LAB — ABO/RH: ABO/RH(D): AB POS

## 2015-02-10 LAB — SURGICAL PATHOLOGY

## 2015-02-10 LAB — HEMOGLOBIN: Hemoglobin: 12.6 g/dL (ref 12.0–16.0)

## 2015-02-10 MED ORDER — OXYCODONE-ACETAMINOPHEN 5-325 MG PO TABS: 1.0000 | ORAL_TABLET | ORAL | Status: AC | PRN

## 2015-02-10 NOTE — Discharge Summary (Signed)
Gynecology Physician Postoperative Discharge Summary  Patient ID: Dawn Swanson MRN: [id number] DOB/AGE: 1950/10/04 64 y.o.  Admit Date: 02/09/2015 Discharge Date: 02/10/2015  Preoperative Diagnoses: Pelvic organ prolapse  Procedures: Procedure(s) (LRB): HYSTERECTOMY VAGINAL/BSO (N/A) SALPINGO OOPHORECTOMY (Bilateral) ANTERIOR REPAIR (CYSTOCELE) (N/A)  Significant Labs: CBC Latest Ref Rng 02/10/2015 01/25/2015  WBC 3.6 - 11.0 K/uL - 4.8  Hemoglobin 12.0 - 16.0 g/dL 12.6 13.2  Hematocrit 35.0 - 47.0 % - 39.4  Platelets 150 - 440 K/uL - 195    Hospital Course:  Dawn Swanson is a 64 y.o. No obstetric history on file.  admitted for scheduled surgery.  She underwent the procedures as mentioned above, her operation was uncomplicated. For further details about surgery, please refer to the operative report. Patient had an uncomplicated postoperative course. By time of discharge on POD#1, her pain was controlled on oral pain medications; she was ambulating, voiding without difficulty, tolerating regular diet and passing flatus. She was deemed stable for discharge to home.   Discharge Exam: Blood pressure 119/65, pulse 86, temperature 99 F (37.2 C), temperature source Oral, resp. rate 12, height 5\' 7"  (1.702 m), weight 63.504 kg (140 lb), SpO2 96 %. General appearance: alert and no distress  Resp: clear to auscultation bilaterally  Cardio: regular rate and rhythm  GI: soft, non-tender; bowel sounds normal; no masses, no organomegaly.  Incision: no vag bleeding or concerns noted Pelvic: scant blood on pad  Extremities: extremities normal, atraumatic, no cyanosis or edema and Homans sign is negative, no sign of DVT  Discharged Condition: Stable  Disposition: Final discharge disposition not confirmed     Medication List    TAKE these medications        oxyCODONE-acetaminophen 5-325 MG per tablet  Commonly known as:  PERCOCET/ROXICET  Take 1-2 tablets by mouth every 4 (four) hours  as needed (moderate to severe pain (when tolerating fluids)).           Follow-up Information    Go to Hoyt Koch, MD.   Specialty:  Obstetrics and Gynecology   Why:  As Scheduled   Contact information:   [address] [place] [address] [phone]       Barnett Applebaum, MD

## 2015-02-10 NOTE — Discharge Summary (Deleted)
Physician Discharge Summary  Patient ID: Dawn Swanson MRN: [id number] DOB/AGE: 12-10-1950 64 y.o.  Admit date: 02/09/2015 Discharge date: 02/10/2015  Admission Diagnoses: Principal Problem:   Prolapse of female pelvic organs Active Problems:   Cystocele, midline   S/P hysterectomy   No post-op complications   Discharge Diagnoses:  Principal Problem:   Prolapse of female pelvic organs Active Problems:   Cystocele, midline   S/P hysterectomy   No post-op complications   Discharged Condition: good  Hospital Course: Routine Post Op from Vaginal hysterectomy  Consults: None  Significant Diagnostic Studies: labs: Normal Blood Count post op  Treatments: surgery: TVH, BSO, Ant Repair  Discharge Exam: Blood pressure 119/65, pulse 86, temperature 99 F (37.2 C), temperature source Oral, resp. rate 12, height 5\' 7"  (1.702 m), weight 63.504 kg (140 lb), SpO2 96 %. General appearance: alert and cooperative GI: soft, non-tender; bowel sounds normal; no masses,  no organomegaly No sig vag bleeding; packing and foley have been removed  Disposition: Final discharge disposition not confirmed Pelvic rest for 6 weeks No heavy lifting for two weeks    Medication List    TAKE these medications        oxyCODONE-acetaminophen 5-325 MG per tablet  Commonly known as:  PERCOCET/ROXICET  Take 1-2 tablets by mouth every 4 (four) hours as needed (moderate to severe pain (when tolerating fluids)).           Follow-up Information    Go to Hoyt Koch, MD.   Specialty:  Obstetrics and Gynecology   Why:  As Scheduled   Contact information:   [address] [place] [address] [phone]       Signed: Hoyt Koch 02/10/2015, 7:05 AM

## 2015-03-02 ENCOUNTER — Encounter: Payer: Self-pay | Admitting: Internal Medicine

## 2015-09-24 ENCOUNTER — Other Ambulatory Visit: Payer: Self-pay | Admitting: Preventative Medicine

## 2015-09-24 DIAGNOSIS — R52 Pain, unspecified: Secondary | ICD-10-CM

## 2015-09-24 DIAGNOSIS — D131 Benign neoplasm of stomach: Secondary | ICD-10-CM

## 2015-09-24 DIAGNOSIS — R945 Abnormal results of liver function studies: Secondary | ICD-10-CM

## 2015-09-24 DIAGNOSIS — R7989 Other specified abnormal findings of blood chemistry: Secondary | ICD-10-CM

## 2015-09-27 ENCOUNTER — Ambulatory Visit
Admission: RE | Admit: 2015-09-27 | Discharge: 2015-09-27 | Disposition: A | Discharge status: Home / Self Care | Payer: Commercial Managed Care - HMO | Source: Ambulatory Visit | Attending: Preventative Medicine | Admitting: Preventative Medicine

## 2015-09-27 DIAGNOSIS — R160 Hepatomegaly, not elsewhere classified: Secondary | ICD-10-CM | POA: Diagnosis not present

## 2015-09-27 DIAGNOSIS — I868 Varicose veins of other specified sites: Secondary | ICD-10-CM | POA: Diagnosis not present

## 2015-09-27 DIAGNOSIS — R7989 Other specified abnormal findings of blood chemistry: Secondary | ICD-10-CM

## 2015-09-27 DIAGNOSIS — R109 Unspecified abdominal pain: Secondary | ICD-10-CM | POA: Diagnosis present

## 2015-09-27 DIAGNOSIS — D131 Benign neoplasm of stomach: Secondary | ICD-10-CM

## 2015-09-27 DIAGNOSIS — R945 Abnormal results of liver function studies: Secondary | ICD-10-CM

## 2015-09-27 DIAGNOSIS — R52 Pain, unspecified: Secondary | ICD-10-CM

## 2015-10-15 ENCOUNTER — Telehealth: Payer: Self-pay | Admitting: *Deleted

## 2015-10-15 NOTE — Telephone Encounter (Signed)
Yes, please give verbal order and fax Dr Bary Leriche notes ASAP

## 2015-10-15 NOTE — Telephone Encounter (Signed)
Faxed information to hospice as requested.

## 2015-10-15 NOTE — Telephone Encounter (Signed)
Denise from [facility]  has requested verbal orders for patient to start hospice. Patients husband stated that patient has become non-verbal and would like to start hospice before the weekend. Langley Gauss will also need demographics, progress notes,and HMP. Please Advise  Fax [phone] Langley Gauss contact [phone]

## 2015-10-15 NOTE — Telephone Encounter (Signed)
Dr. Derrel Nip Please advise in Dr. Bary Leriche absence. Thanks

## 2015-10-15 NOTE — Telephone Encounter (Signed)
I called Hospice and spoke with Langley Gauss and Ivin Booty.  I gave them the verbal order to start hospice.  They need the last office note and demographics on pt faxed asap so they can get started before the weekend.  I also spoke with Blanch Media and Mr Raja Verhoeven.  Explained to Mr Readus that I have ok'd order for hospice.

## 2015-10-15 NOTE — Telephone Encounter (Signed)
Blanch Media [phone] called back and left a vm on triage phone regarding the Hospice referral request for sister. Blanch Media would like to speak to Dr Nicki Reaper concerning her sister she would really appreciate it. Pt is at home in [place]. Thank you!

## 2015-10-15 NOTE — Telephone Encounter (Signed)
Patient's sister would like a call back , she stated that she would like to give an update on patient's condition. Noel Journey  [phone]

## 2015-10-18 ENCOUNTER — Telehealth: Payer: Self-pay | Admitting: Internal Medicine

## 2015-10-18 NOTE — Telephone Encounter (Signed)
Dawn Swanson dropped off pt's death certificate for Dr. Nicki Reaper to sign. Caryl Pina will hand deliver to [facility].

## 2015-10-19 NOTE — Telephone Encounter (Signed)
See previous phone message.  I had not seen pt  For the last 1 1/2 years.  Please contact hospice and find out what they used as her admitting diagnosis so that I can complete the death certificate.

## 2015-10-19 NOTE — Telephone Encounter (Signed)
Placed in Dr. Scott's blue folder 

## 2015-10-19 NOTE — Telephone Encounter (Signed)
Pt was originally admitted to hospice with the Dx code of: K76.89 (Other specified diseases of the liver)

## 2015-10-20 NOTE — Telephone Encounter (Signed)
Contacted [facility] placed up front for pick up & copy made for scan

## 2015-10-20 NOTE — Telephone Encounter (Signed)
Death certificate completed and placed in your box.

## 2015-10-20 DEATH — deceased

## 2016-07-19 IMAGING — US US PELV - US TRANSVAGINAL
1 series · 14 of 25 positions shown · non-contrast
Comparison: None

CLINICAL DATA: Abdominal and pelvic pain. Vomiting. Weight loss.
Uterine prolapse. Postmenopausal female.

EXAM:
TRANSABDOMINAL AND TRANSVAGINAL ULTRASOUND OF PELVIS
TECHNIQUE: Both transabdominal and transvaginal ultrasound examinations of the
pelvis were performed. Transabdominal technique was performed for
global imaging of the pelvis including uterus, ovaries, adnexal
regions, and pelvic cul-de-sac. It was necessary to proceed with
endovaginal exam following the transabdominal exam to visualize the
endometrium and ovaries.

[Series 1: us pelv - us transvaginal · 0.23mm/px · 14 of 114 slices shown]
[im 1/114]
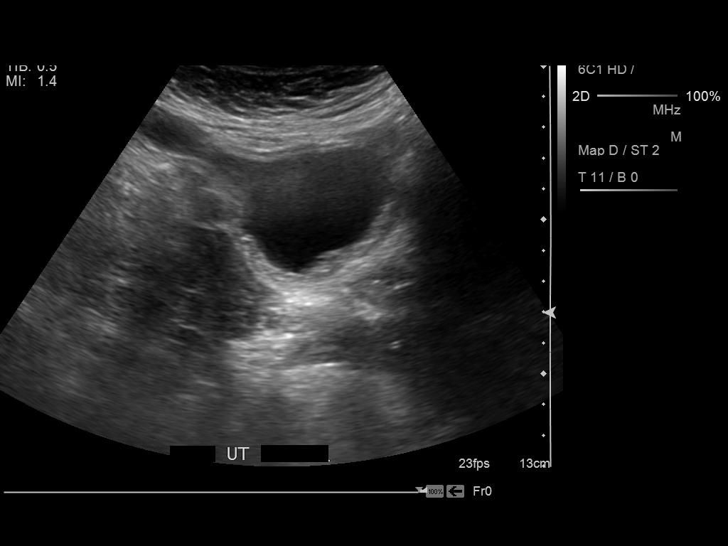
[im 10/114]
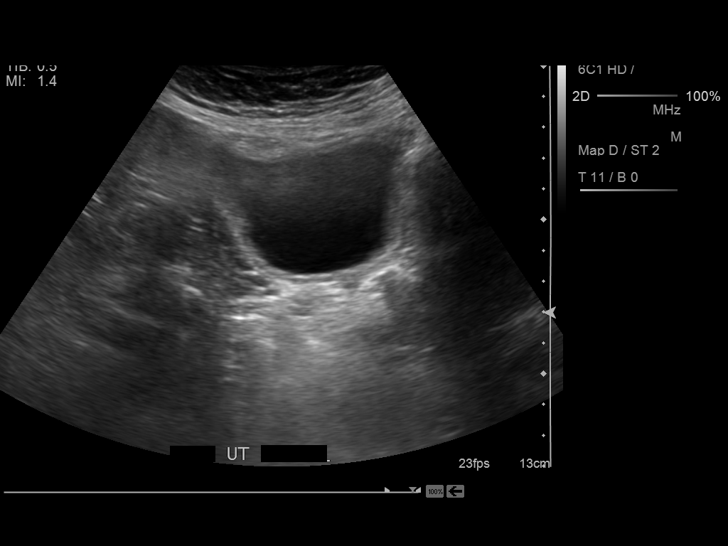
[im 19/114]
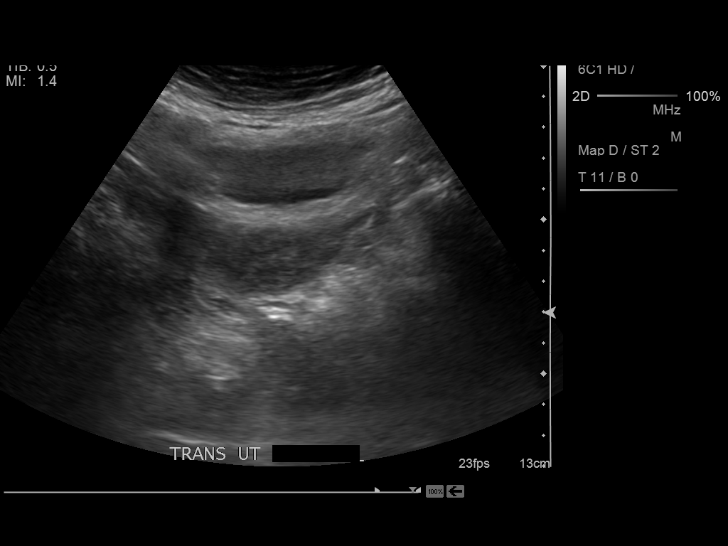
[im 29/114]
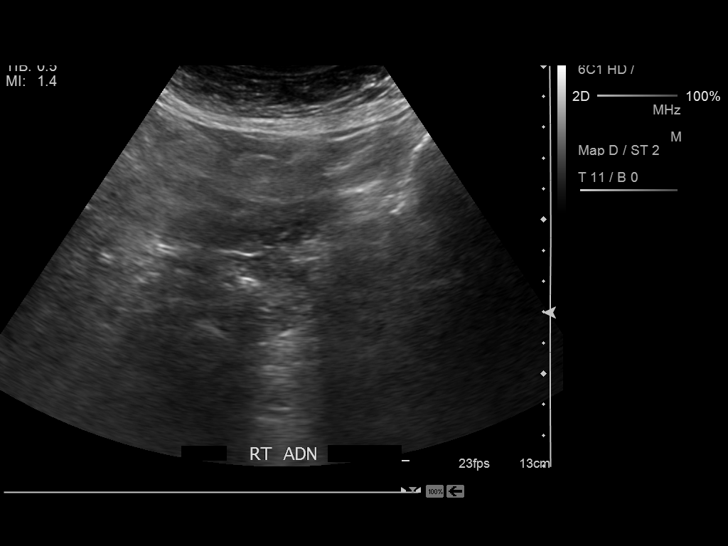
[im 38/114]
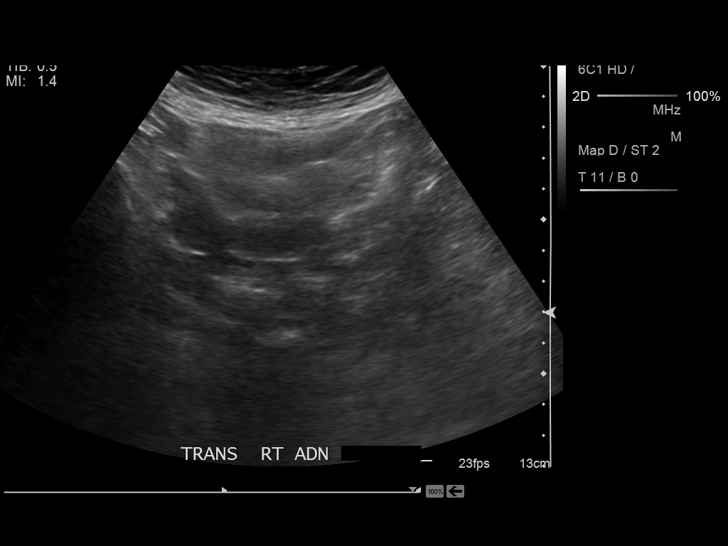
[im 43/114]
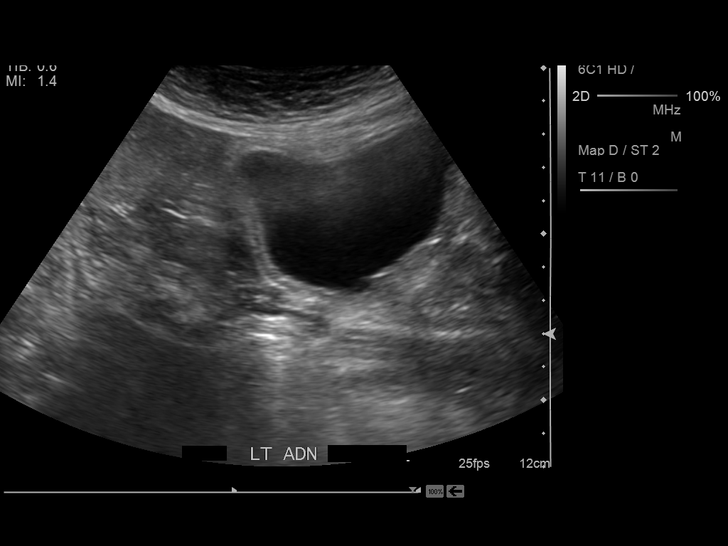
[im 52/114]
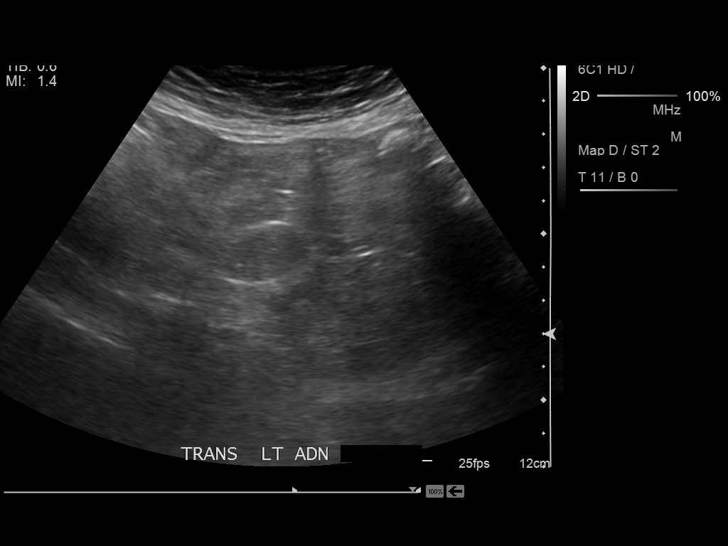
[im 62/114]
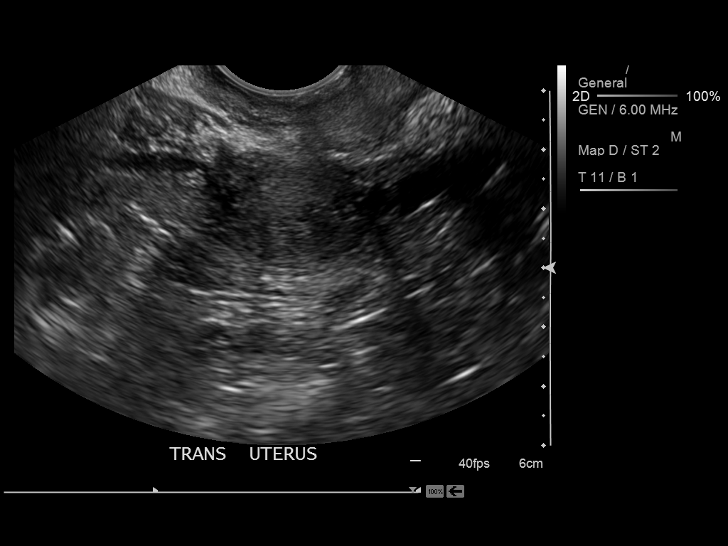
[im 71/114]
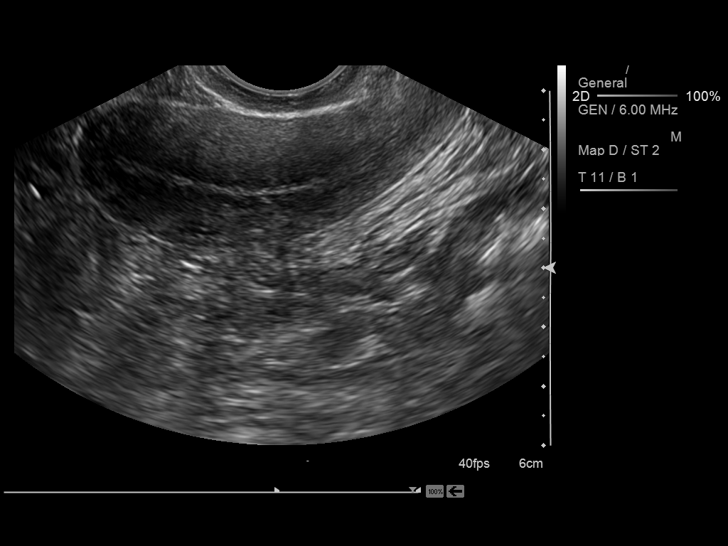
[im 76/114]
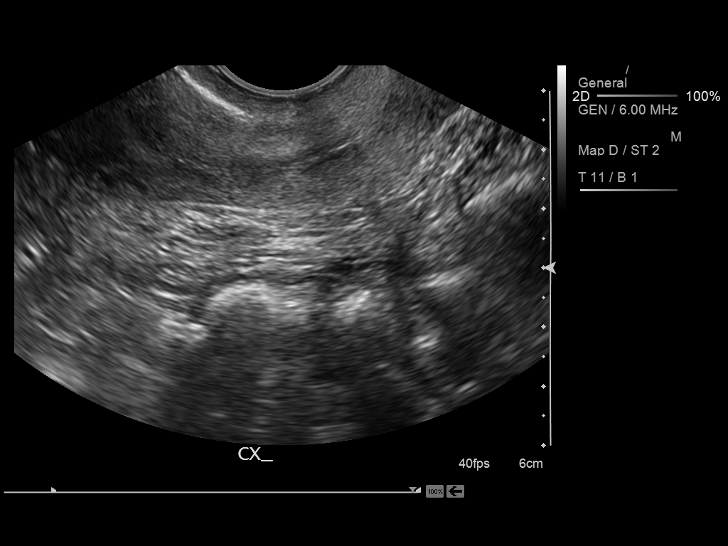
[im 85/114]
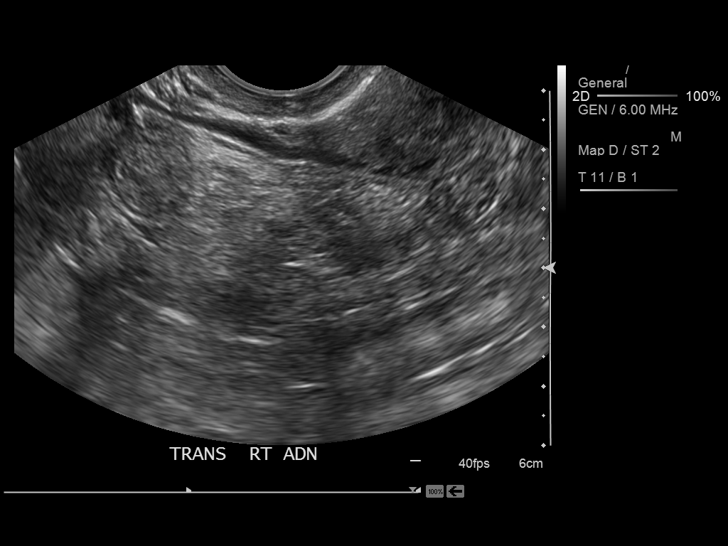
[im 95/114]
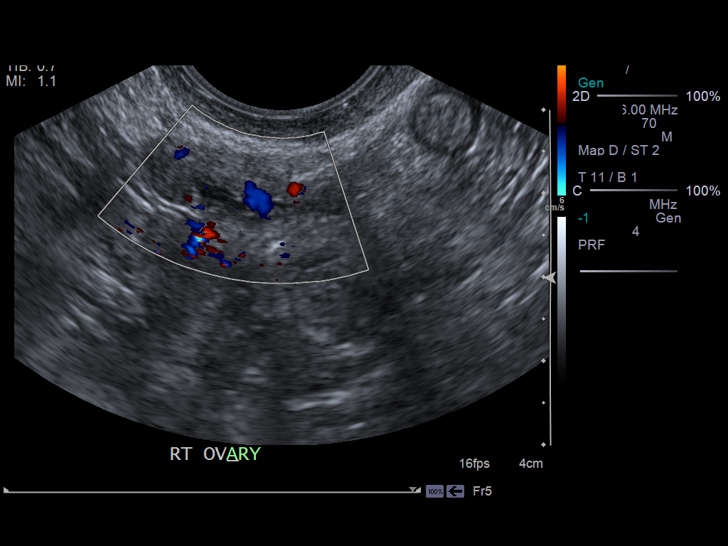
[im 104/114]
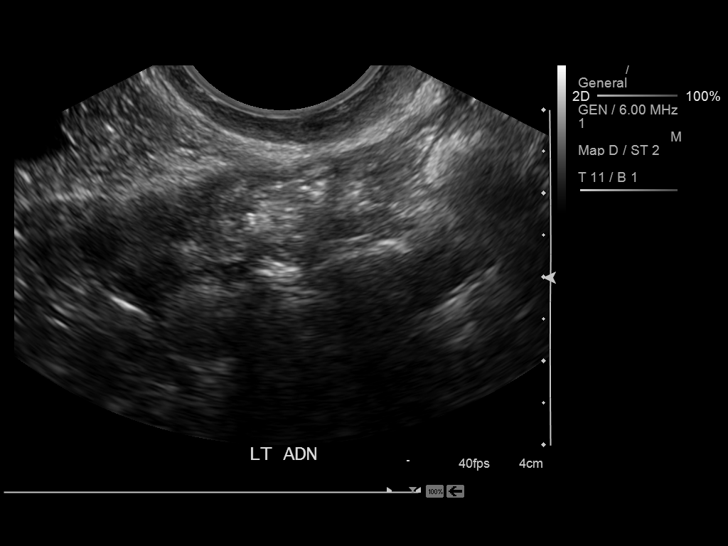
[im 114/114]
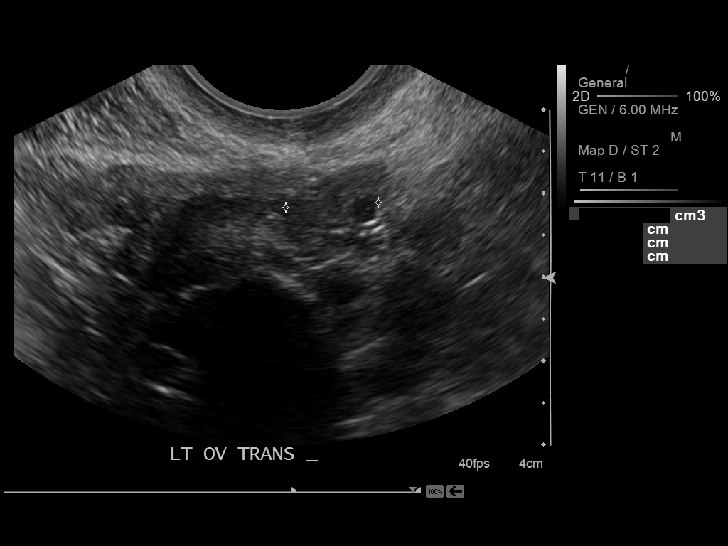

[14 of 25 positions shown; findings below may reference images not displayed]

FINDINGS: Uterus

Measurements: 6.9 x 2.3 x 3.8 cm. No fibroids or other mass
visualized.

Endometrium

Thickness: 3 mm.  No focal abnormality visualized.

Right ovary

Measurements: 1.7 x 0.6 x 1.0 cm. Normal appearance/no adnexal mass.

Left ovary

Measurements: 1.5 x 0.9 x 1.1 cm. Normal appearance/no adnexal mass.

Other findings

No free fluid.
IMPRESSION: Normal appearance of uterus and both ovaries. No pelvic mass or
other significant abnormality identified.

## 2016-07-19 IMAGING — US ABDOMEN ULTRASOUND
1 series · 14 of 25 positions shown · non-contrast
Comparison: None.

CLINICAL DATA: Pain.  Weight loss.

EXAM:
ULTRASOUND ABDOMEN COMPLETE

[Series 1: abdomen ultrasound · 0.35mm/px · 14 of 126 slices shown]
[im 1/126]
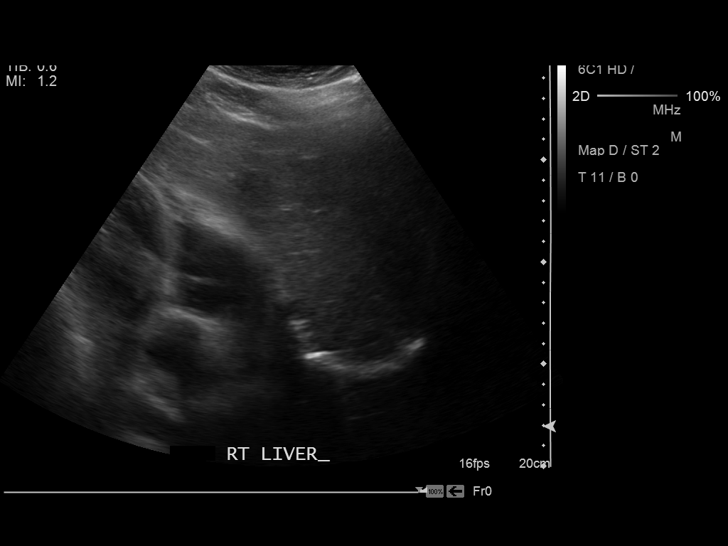
[im 11/126]
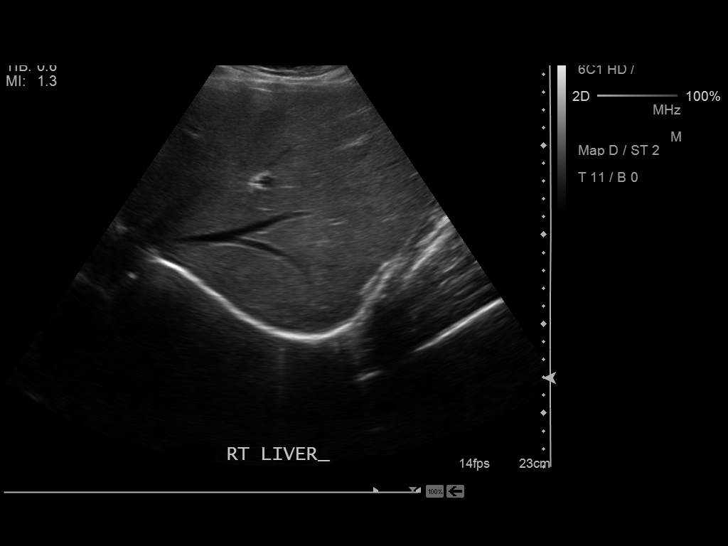
[im 21/126]
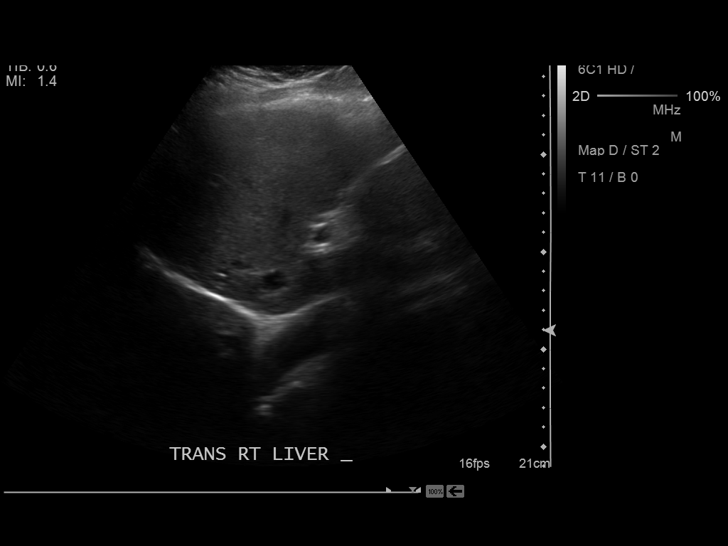
[im 32/126]
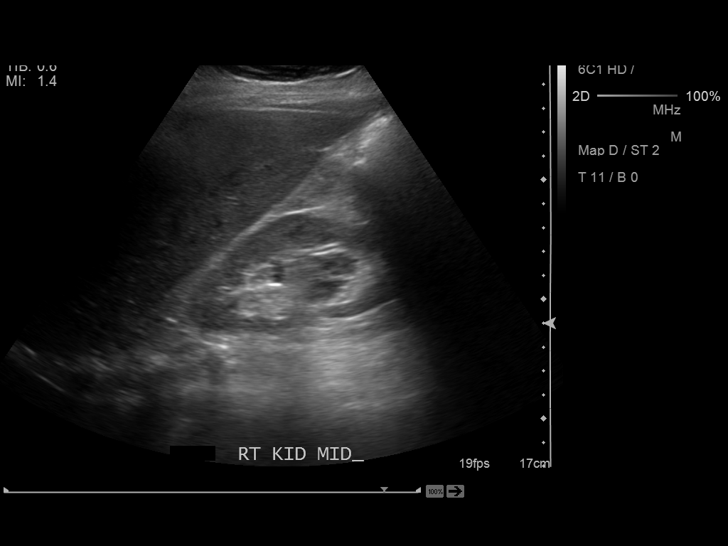
[im 42/126]
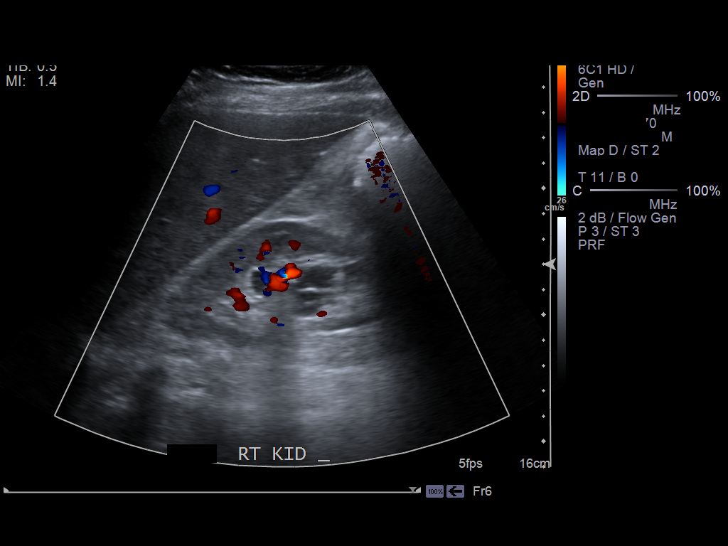
[im 47/126]
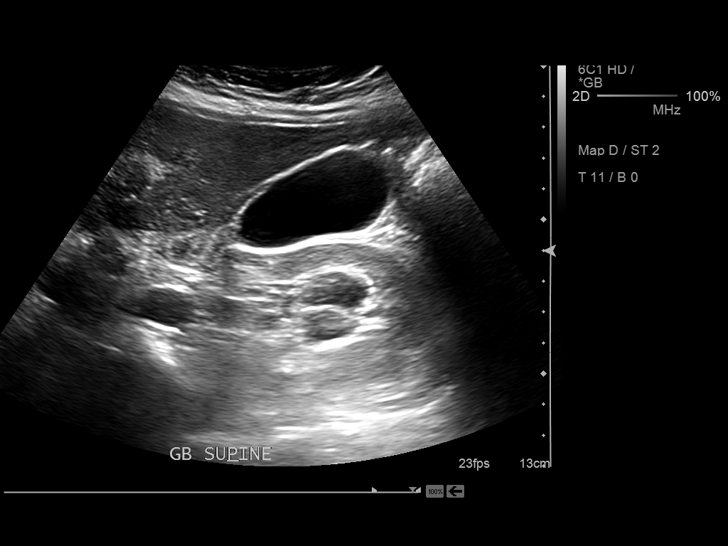
[im 58/126]
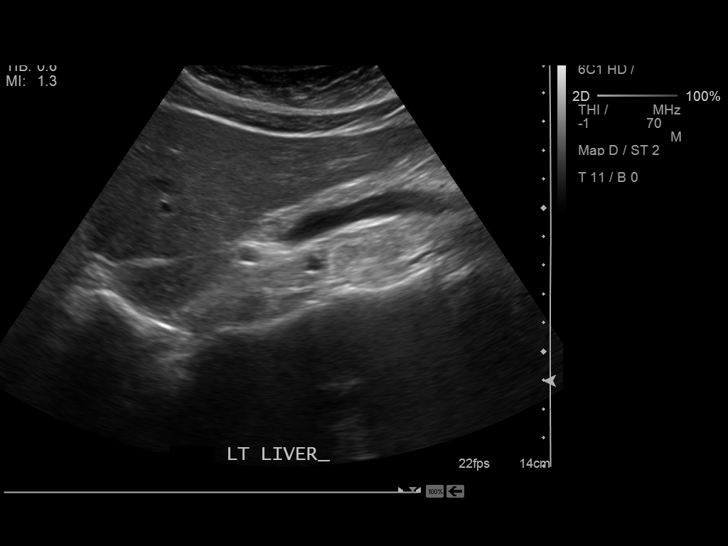
[im 68/126]
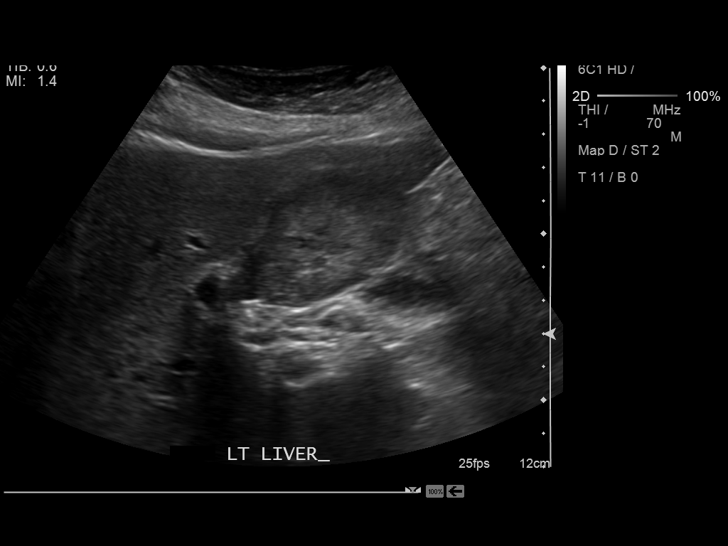
[im 79/126]
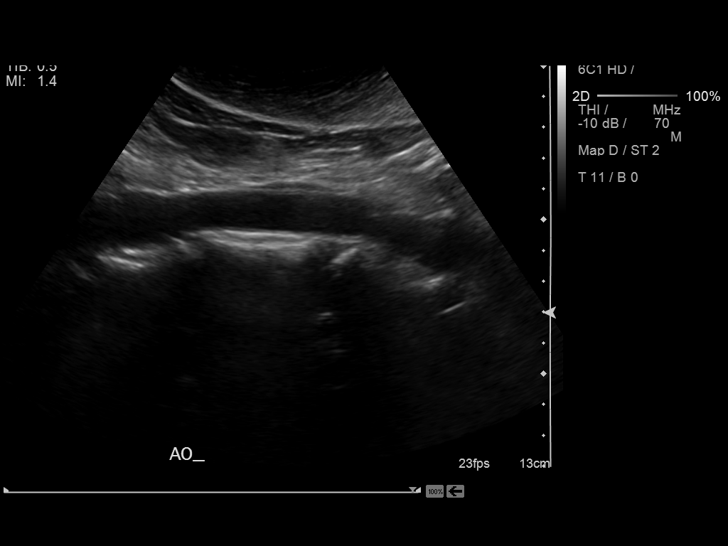
[im 84/126]
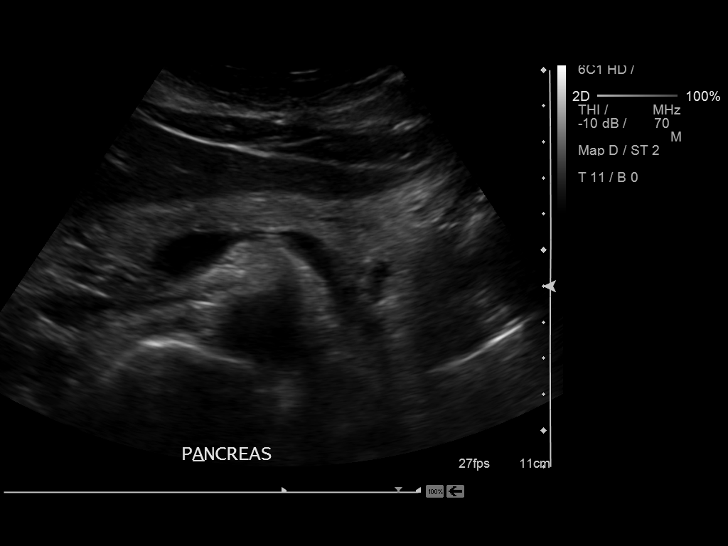
[im 94/126]
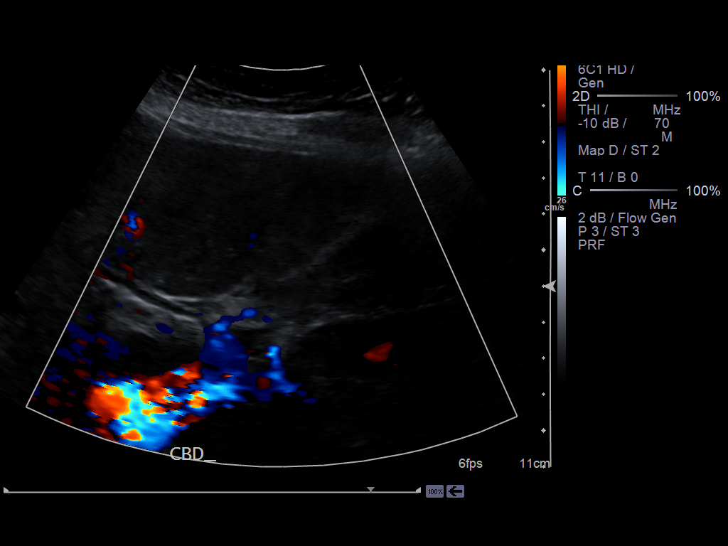
[im 105/126]
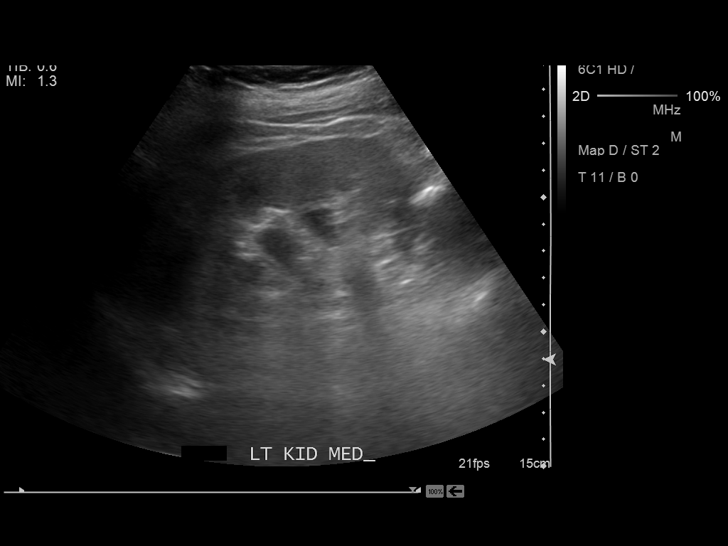
[im 115/126]
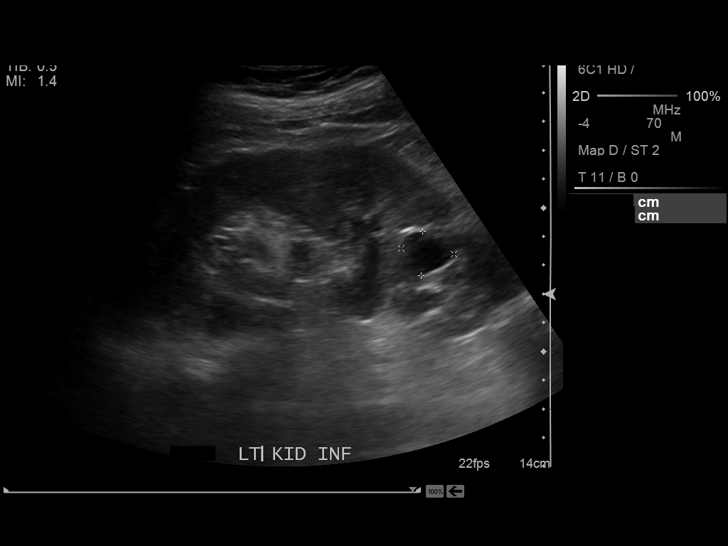
[im 126/126]
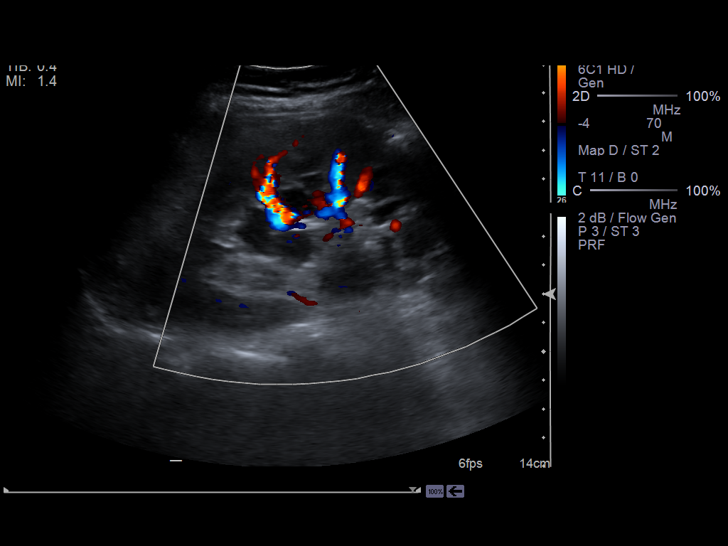

[14 of 25 positions shown; findings below may reference images not displayed]

FINDINGS: Gallbladder:

No gallstones or wall thickening visualized. No sonographic Murphy
sign noted.

Common bile duct:

Diameter: 3.8 mm

Liver:

There is a 5.5 x 3.5 x 4.2 cm mass in the left lobe liver. This mass
appears solid. MRI liver is suggested for further evaluation.

IVC:

No abnormality visualized.

Pancreas:

Visualized portion unremarkable.

Spleen:

Size and appearance within normal limits.

Right Kidney:

Length: 9.8 cm. Echogenicity within normal limits. No significant
mass or hydronephrosis visualized. Multiple parapelvic cysts, the
largest measuring 2.3 cm.

Left Kidney:

Length: 13.6 cm. Echogenicity within normal limits. No significant
mass or hydronephrosis visualized. Multiple parapelvic cysts, the
largest measures 2.2 cm

Abdominal aorta:

No aneurysm visualized.

Other findings:

None.
IMPRESSION: 1. 5.5 x 3.5 x 4.2 cm solid mass left lobe of the liver.
Gadolinium-enhanced MRI the liver suggest for further evaluation.
2. Mild right renal atrophy. Multiple bilateral renal parapelvic
cysts.

## 2017-07-13 IMAGING — US US ABDOMEN COMPLETE
1 series · 13 of 25 positions shown · non-contrast
Comparison: Abdomen ultrasound dated 04/28/2014 and abdominal MRI
dated 05/29/2014.

CLINICAL DATA: Gastric adenoma, elevated liver function tests,
abdominal pain.

EXAM:
ABDOMEN ULTRASOUND COMPLETE

[Series 1: us abdomen complete · 0.30mm/px · 13 of 109 slices shown]
[im 1/109]
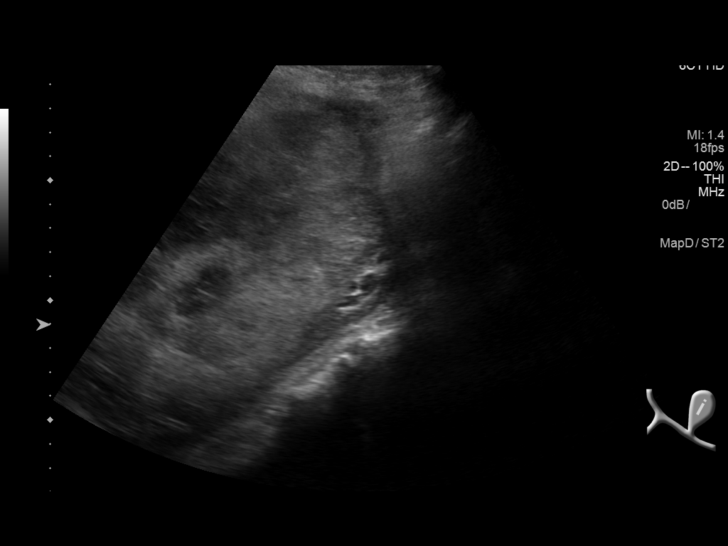
[im 10/109]
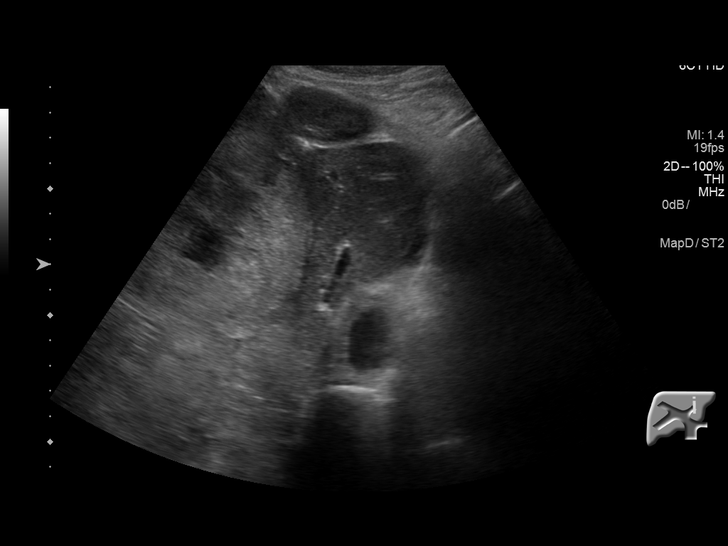
[im 19/109]
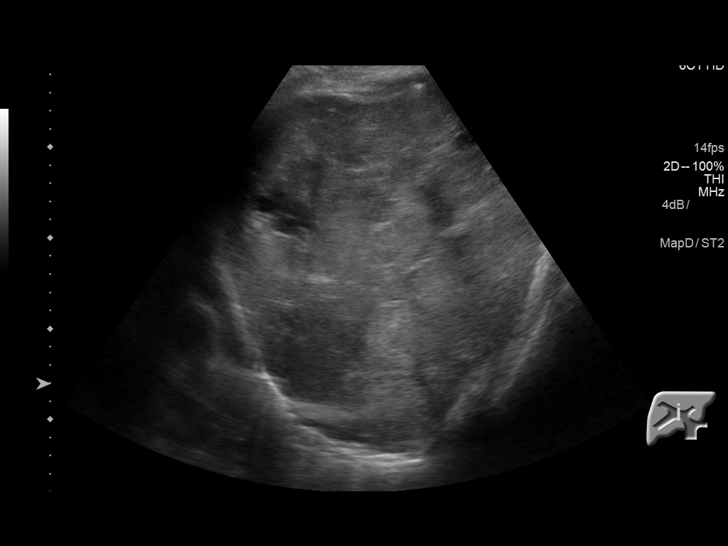
[im 28/109]
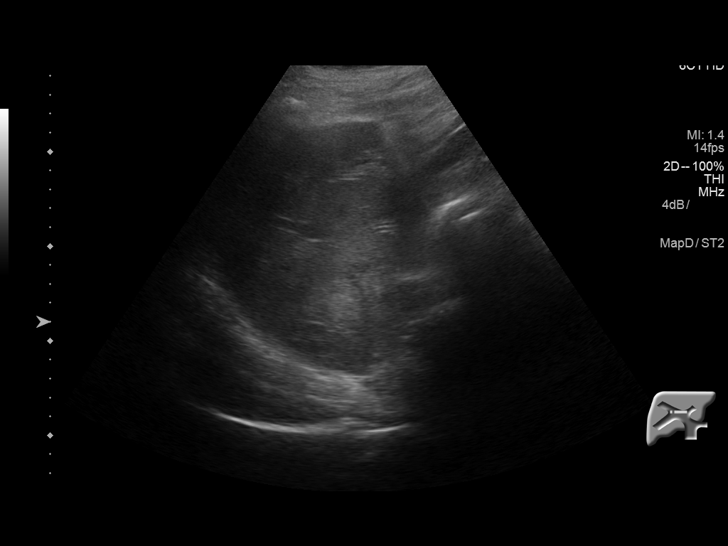
[im 37/109]
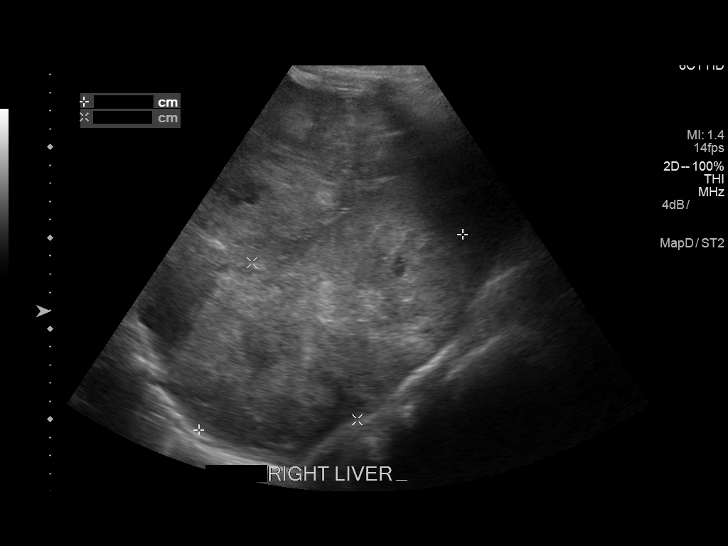
[im 46/109]
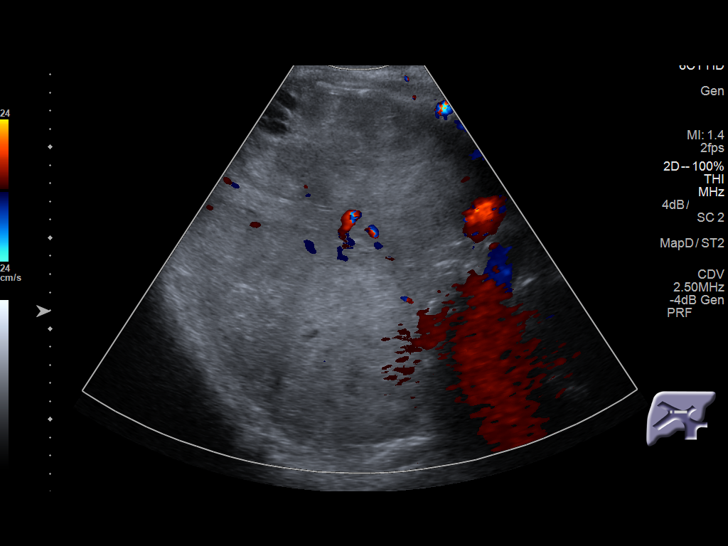
[im 55/109]
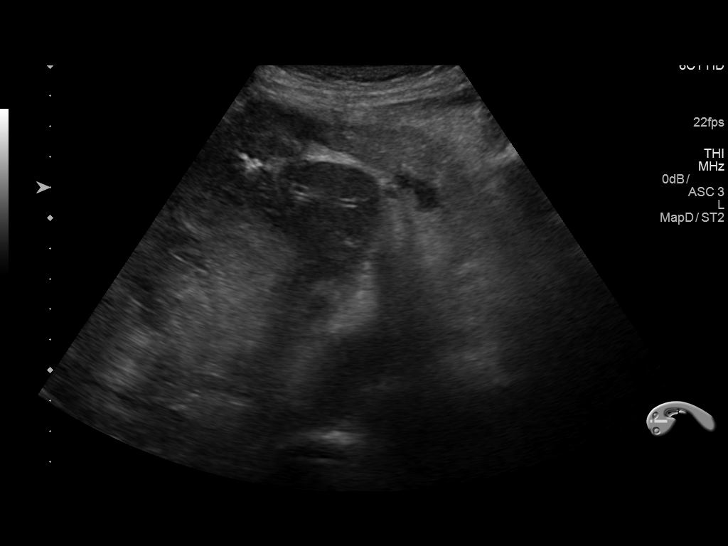
[im 64/109]
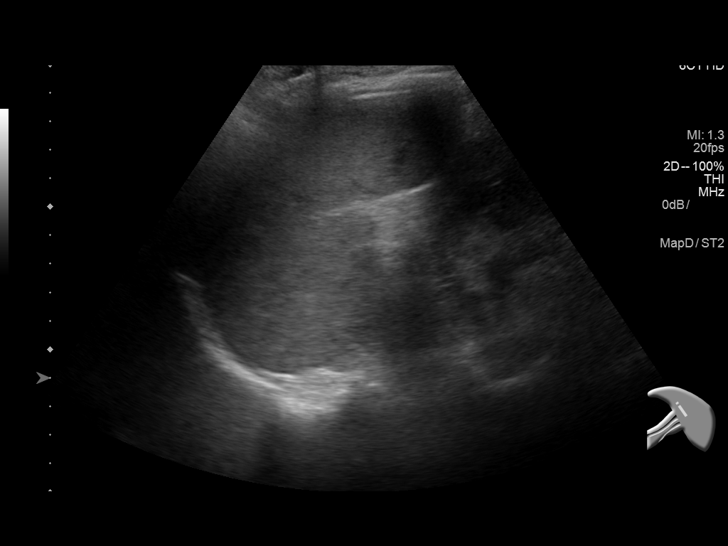
[im 73/109]
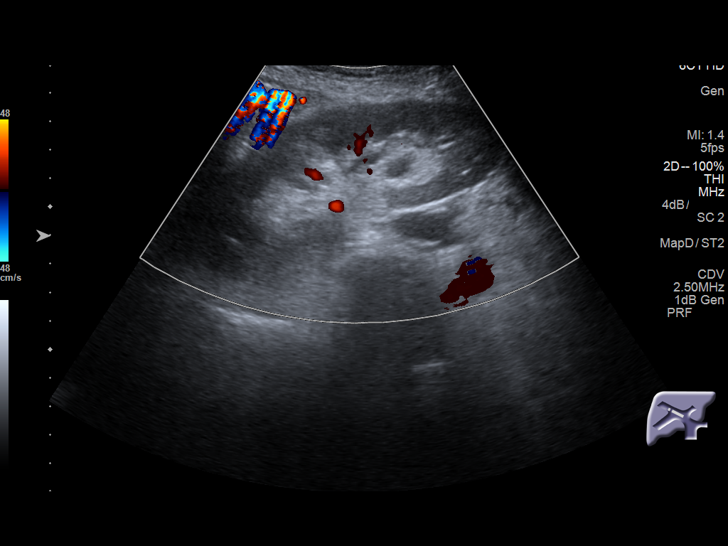
[im 82/109]
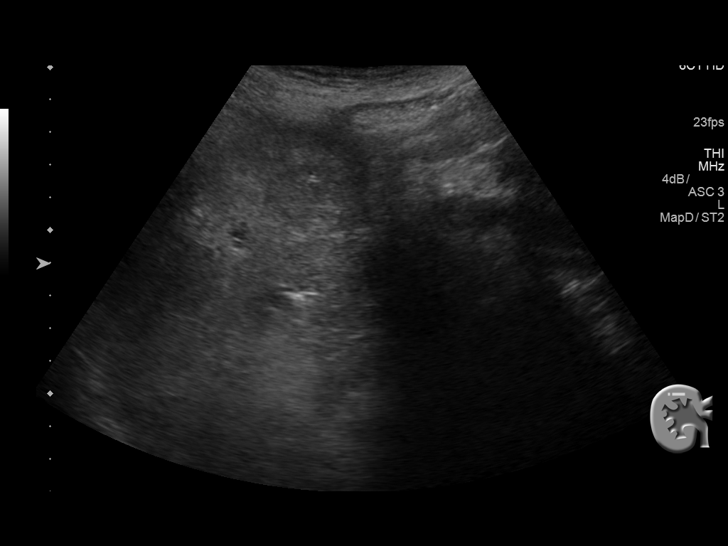
[im 91/109]
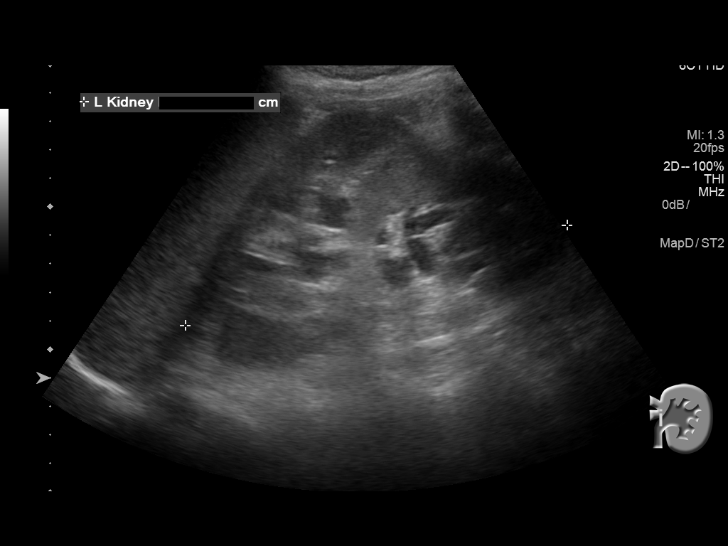
[im 100/109]
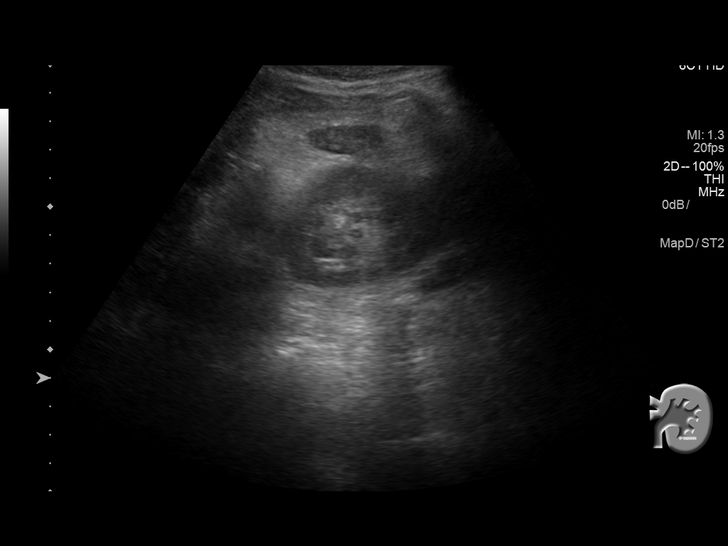
[im 109/109]
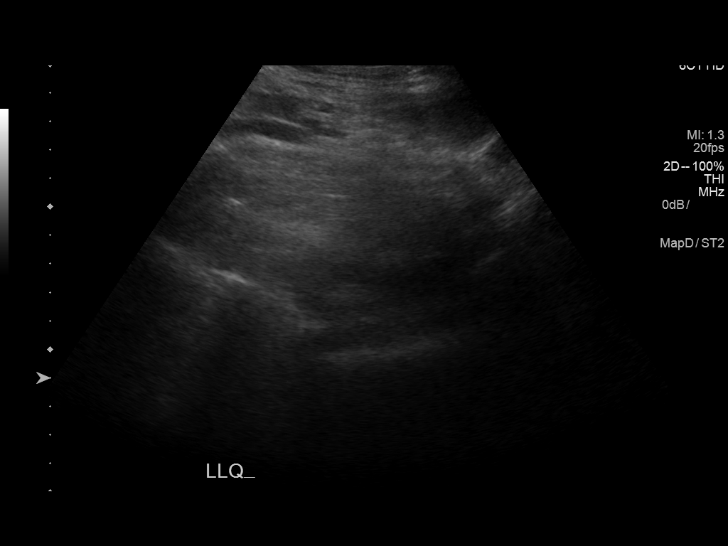

[13 of 25 positions shown; findings below may reference images not displayed]

FINDINGS: Gallbladder: Surgically absent.

Common bile duct: Diameter: Normal at 5 mm.

Liver: Liver is diffusely inhomogeneous due to numerous confluent
masses within the bilateral liver lobes. Largest measurable mass is
within the right liver lobe measuring 18 x 10 x 12 cm.

IVC: No abnormality visualized.

Pancreas: Mass in the vicinity of pancreatic head measures 3.6 x
x 3.4 cm, probably exophytic to the adjacent liver parenchyma.
Remainder of the visualized pancreas appears unremarkable. No
pancreatic ductal dilatation seen. No peripancreatic fluid.

Spleen: Size and appearance within normal limits. Varices noted in
the splenic hilum.

Right Kidney: Length: 12.3 cm. Mild pelviectasis without frank
hydronephrosis. Hypoechoic mass within the upper pole of the right
kidney measures 0.9 x 0.8 cm, too small to characterize, most
suggestive of benign cyst.

Left Kidney: Length: 13.8 cm. Mild pelviectasis without frank
hydronephrosis. Otherwise unremarkable without mass, cyst or stone.

Abdominal aorta: No aneurysm visualized.

Other findings: No free fluid seen.
IMPRESSION: 1. Numerous confluent masses within the liver consistent with
metastases. Largest measurable mass is within the right liver lobe
measuring 18 x 10 x 12 cm. A mass in the vicinity of the pancreatic
head measures 3.6 x 2.6 x 3.4 cm, of uncertain origin, favored to be
exophytic to the liver rather than a primary pancreatic head mass.
2. Both kidneys with mild pelviectasis without frank hydronephrosis.
Probable cyst within the upper pole of the right kidney, too small
to definitively characterize, measuring 0.9 x 0.8 cm.
3. Splenic varices.
These results will be called to the ordering clinician or
representative by the Radiologist Assistant, and communication
documented in the PACS or zVision Dashboard.
